# Patient Record
Sex: Male | Born: 1960 | Race: White | Hispanic: No | Marital: Married | Smoking: Never smoker
Health system: Southern US, Community
[De-identification: ages and names within clinical notes are randomized; demographics above are authoritative.]

## PROBLEM LIST (undated history)

## (undated) DIAGNOSIS — C801 Malignant (primary) neoplasm, unspecified: Secondary | ICD-10-CM

## (undated) DIAGNOSIS — E079 Disorder of thyroid, unspecified: Secondary | ICD-10-CM

## (undated) DIAGNOSIS — E7800 Pure hypercholesterolemia, unspecified: Secondary | ICD-10-CM

## (undated) HISTORY — DX: Malignant (primary) neoplasm, unspecified: C80.1

## (undated) HISTORY — PX: EXCISIONAL HEMORRHOIDECTOMY: SHX1541

## (undated) HISTORY — PX: SHOULDER ARTHROSCOPY WITH OPEN ROTATOR CUFF REPAIR: SHX6092

## (undated) HISTORY — PX: INGUINAL HERNIA REPAIR: SHX194

## (undated) HISTORY — DX: Pure hypercholesterolemia, unspecified: E78.00

---

## 2020-06-16 ENCOUNTER — Other Ambulatory Visit: Payer: Self-pay

## 2020-06-16 ENCOUNTER — Encounter (HOSPITAL_COMMUNITY): Payer: Self-pay | Admitting: Emergency Medicine

## 2020-06-16 ENCOUNTER — Inpatient Hospital Stay (HOSPITAL_COMMUNITY)
Admission: EM | Admit: 2020-06-16 | Discharge: 2020-06-18 | DRG: 092 | Disposition: A | Discharge status: Home / Self Care | Payer: BLUE CROSS/BLUE SHIELD | Attending: Neurology | Admitting: Neurology

## 2020-06-16 DIAGNOSIS — E785 Hyperlipidemia, unspecified: Secondary | ICD-10-CM | POA: Diagnosis present

## 2020-06-16 DIAGNOSIS — N179 Acute kidney failure, unspecified: Secondary | ICD-10-CM | POA: Diagnosis present

## 2020-06-16 DIAGNOSIS — Z79890 Hormone replacement therapy (postmenopausal): Secondary | ICD-10-CM

## 2020-06-16 DIAGNOSIS — Z79899 Other long term (current) drug therapy: Secondary | ICD-10-CM

## 2020-06-16 DIAGNOSIS — D6851 Activated protein C resistance: Secondary | ICD-10-CM | POA: Diagnosis present

## 2020-06-16 DIAGNOSIS — E039 Hypothyroidism, unspecified: Secondary | ICD-10-CM | POA: Diagnosis present

## 2020-06-16 DIAGNOSIS — Z20822 Contact with and (suspected) exposure to covid-19: Secondary | ICD-10-CM | POA: Diagnosis present

## 2020-06-16 DIAGNOSIS — G08 Intracranial and intraspinal phlebitis and thrombophlebitis: Secondary | ICD-10-CM | POA: Diagnosis not present

## 2020-06-16 DIAGNOSIS — R569 Unspecified convulsions: Secondary | ICD-10-CM

## 2020-06-16 DIAGNOSIS — R29700 NIHSS score 0: Secondary | ICD-10-CM | POA: Diagnosis present

## 2020-06-16 DIAGNOSIS — I619 Nontraumatic intracerebral hemorrhage, unspecified: Secondary | ICD-10-CM | POA: Diagnosis present

## 2020-06-16 DIAGNOSIS — R519 Headache, unspecified: Secondary | ICD-10-CM | POA: Diagnosis present

## 2020-06-16 HISTORY — DX: Disorder of thyroid, unspecified: E07.9

## 2020-06-16 NOTE — ED Triage Notes (Signed)
Pt BIB [facility] from home, wife reports pt had seizure like activity in his BUE, drooling, and increased respirations. Unknow how long this lasted. No hx seizures, wife reports pt was confused and slightly combative as well. Pt reports he has been being treated for a "viral infection", taking steroids and anti-viral medications. Reports negative covid. Pt A&O x 4 on arrival.

## 2020-06-17 ENCOUNTER — Inpatient Hospital Stay (HOSPITAL_COMMUNITY): Payer: BLUE CROSS/BLUE SHIELD

## 2020-06-17 ENCOUNTER — Encounter (HOSPITAL_COMMUNITY): Payer: Self-pay | Admitting: Neurology

## 2020-06-17 ENCOUNTER — Emergency Department (HOSPITAL_COMMUNITY): Payer: BLUE CROSS/BLUE SHIELD

## 2020-06-17 DIAGNOSIS — R569 Unspecified convulsions: Secondary | ICD-10-CM | POA: Diagnosis present

## 2020-06-17 DIAGNOSIS — G08 Intracranial and intraspinal phlebitis and thrombophlebitis: Secondary | ICD-10-CM | POA: Diagnosis present

## 2020-06-17 DIAGNOSIS — R29700 NIHSS score 0: Secondary | ICD-10-CM | POA: Diagnosis present

## 2020-06-17 DIAGNOSIS — Z79890 Hormone replacement therapy (postmenopausal): Secondary | ICD-10-CM | POA: Diagnosis not present

## 2020-06-17 DIAGNOSIS — E785 Hyperlipidemia, unspecified: Secondary | ICD-10-CM | POA: Diagnosis present

## 2020-06-17 DIAGNOSIS — N179 Acute kidney failure, unspecified: Secondary | ICD-10-CM | POA: Diagnosis present

## 2020-06-17 DIAGNOSIS — E039 Hypothyroidism, unspecified: Secondary | ICD-10-CM | POA: Diagnosis present

## 2020-06-17 DIAGNOSIS — I619 Nontraumatic intracerebral hemorrhage, unspecified: Secondary | ICD-10-CM | POA: Diagnosis present

## 2020-06-17 DIAGNOSIS — Z79899 Other long term (current) drug therapy: Secondary | ICD-10-CM | POA: Diagnosis not present

## 2020-06-17 DIAGNOSIS — Z20822 Contact with and (suspected) exposure to covid-19: Secondary | ICD-10-CM | POA: Diagnosis present

## 2020-06-17 DIAGNOSIS — D6851 Activated protein C resistance: Secondary | ICD-10-CM | POA: Diagnosis present

## 2020-06-17 LAB — COMPREHENSIVE METABOLIC PANEL
ALT: 31 U/L (ref 0–44)
AST: 36 U/L (ref 15–41)
Albumin: 3.7 g/dL (ref 3.5–5.0)
Alkaline Phosphatase: 79 U/L (ref 38–126)
Anion gap: 22 — ABNORMAL HIGH (ref 5–15)
BUN: 8 mg/dL (ref 6–20)
CO2: 12 mmol/L — ABNORMAL LOW (ref 22–32)
Calcium: 8.4 mg/dL — ABNORMAL LOW (ref 8.9–10.3)
Chloride: 103 mmol/L (ref 98–111)
Creatinine, Ser: 1.26 mg/dL — ABNORMAL HIGH (ref 0.61–1.24)
GFR calc Af Amer: >60 mL/min (ref >60)
GFR calc non Af Amer: >60 mL/min (ref >60)
Glucose, Bld: 109 mg/dL — ABNORMAL HIGH (ref 70–99)
Potassium: 3.5 mmol/L (ref 3.5–5.1)
Sodium: 137 mmol/L (ref 135–145)
Total Bilirubin: 0.6 mg/dL (ref 0.3–1.2)
Total Protein: 6.3 g/dL — ABNORMAL LOW (ref 6.5–8.1)

## 2020-06-17 LAB — URINALYSIS, ROUTINE W REFLEX MICROSCOPIC
Bilirubin Urine: NEGATIVE
Glucose, UA: NEGATIVE mg/dL
Ketones, ur: NEGATIVE mg/dL
Leukocytes,Ua: NEGATIVE
Nitrite: NEGATIVE
Protein, ur: NEGATIVE mg/dL
Specific Gravity, Urine: 1.028 (ref 1.005–1.030)
pH: 5 (ref 5.0–8.0)

## 2020-06-17 LAB — CBC WITH DIFFERENTIAL/PLATELET
Abs Immature Granulocytes: 0.47 10*3/uL — ABNORMAL HIGH (ref 0.00–0.07)
Basophils Absolute: 0.1 10*3/uL (ref 0.0–0.1)
Basophils Relative: 1 %
Eosinophils Absolute: 0.1 10*3/uL (ref 0.0–0.5)
Eosinophils Relative: 1 %
HCT: 48.4 % (ref 39.0–52.0)
Hemoglobin: 15.4 g/dL (ref 13.0–17.0)
Immature Granulocytes: 5 %
Lymphocytes Relative: 19 %
Lymphs Abs: 1.9 10*3/uL (ref 0.7–4.0)
MCH: 27.5 pg (ref 26.0–34.0)
MCHC: 31.8 g/dL (ref 30.0–36.0)
MCV: 86.4 fL (ref 80.0–100.0)
Monocytes Absolute: 0.8 10*3/uL (ref 0.1–1.0)
Monocytes Relative: 8 %
Neutro Abs: 6.8 10*3/uL (ref 1.7–7.7)
Neutrophils Relative %: 66 %
Platelets: 269 10*3/uL (ref 150–400)
RBC: 5.6 MIL/uL (ref 4.22–5.81)
RDW: 13.4 % (ref 11.5–15.5)
WBC: 10.2 10*3/uL (ref 4.0–10.5)
nRBC: 0 % (ref 0.0–0.2)

## 2020-06-17 LAB — ANTITHROMBIN III: AntiThromb III Func: 97 % (ref 75–120)

## 2020-06-17 LAB — SARS CORONAVIRUS 2 BY RT PCR (HOSPITAL ORDER, PERFORMED IN ~~LOC~~ HOSPITAL LAB): SARS Coronavirus 2: NEGATIVE

## 2020-06-17 LAB — SEDIMENTATION RATE: Sed Rate: 3 mm/hr (ref 0–16)

## 2020-06-17 LAB — RAPID URINE DRUG SCREEN, HOSP PERFORMED
Amphetamines: NOT DETECTED
Barbiturates: NOT DETECTED
Benzodiazepines: NOT DETECTED
Cocaine: NOT DETECTED
Opiates: NOT DETECTED
Tetrahydrocannabinol: NOT DETECTED

## 2020-06-17 LAB — HIV ANTIBODY (ROUTINE TESTING W REFLEX): HIV Screen 4th Generation wRfx: NONREACTIVE

## 2020-06-17 LAB — MRSA PCR SCREENING: MRSA by PCR: NEGATIVE

## 2020-06-17 LAB — LACTIC ACID, PLASMA: Lactic Acid, Venous: 9.6 mmol/L (ref 0.5–1.9)

## 2020-06-17 LAB — ETHANOL: Alcohol, Ethyl (B): 10 mg/dL — ABNORMAL HIGH (ref <10)

## 2020-06-17 LAB — C-REACTIVE PROTEIN: CRP: 0.8 mg/dL (ref <1.0)

## 2020-06-17 LAB — HEPARIN LEVEL (UNFRACTIONATED): Heparin Unfractionated: 0.18 IU/mL — ABNORMAL LOW (ref 0.30–0.70)

## 2020-06-17 LAB — CK: Total CK: 210 U/L (ref 49–397)

## 2020-06-17 MED ORDER — SODIUM CHLORIDE 0.9 % IV SOLN
1000.0000 mL | INTRAVENOUS | Status: DC
  Administered 2020-06-17: 1000 mL via INTRAVENOUS

## 2020-06-17 MED ORDER — ACETAMINOPHEN 325 MG PO TABS
650.0000 mg | ORAL_TABLET | ORAL | Status: DC | PRN
  Administered 2020-06-17: 650 mg via ORAL
  Filled 2020-06-17: qty 2

## 2020-06-17 MED ORDER — SODIUM CHLORIDE 0.9 % IV SOLN: INTRAVENOUS | Status: DC

## 2020-06-17 MED ORDER — STROKE: EARLY STAGES OF RECOVERY BOOK
Freq: Once | Status: AC
  Filled 2020-06-17: qty 1

## 2020-06-17 MED ORDER — CHLORHEXIDINE GLUCONATE CLOTH 2 % EX PADS
6.0000 | MEDICATED_PAD | Freq: Every day | CUTANEOUS | Status: DC
  Administered 2020-06-17 – 2020-06-18 (×2): 6 via TOPICAL

## 2020-06-17 MED ORDER — ACETAMINOPHEN 650 MG RE SUPP: 650.0000 mg | RECTAL | Status: DC | PRN

## 2020-06-17 MED ORDER — LEVOTHYROXINE SODIUM 25 MCG PO TABS
25.0000 ug | ORAL_TABLET | Freq: Every day | ORAL | Status: DC
  Administered 2020-06-17 – 2020-06-18 (×2): 25 ug via ORAL
  Filled 2020-06-17 (×2): qty 1

## 2020-06-17 MED ORDER — LORAZEPAM 2 MG/ML IJ SOLN
INTRAMUSCULAR | Status: AC
  Administered 2020-06-17: 2 mg
  Filled 2020-06-17: qty 1

## 2020-06-17 MED ORDER — SODIUM CHLORIDE 0.9 % IV BOLUS (SEPSIS)
1000.0000 mL | Freq: Once | INTRAVENOUS | Status: AC
  Administered 2020-06-17: 1000 mL via INTRAVENOUS

## 2020-06-17 MED ORDER — TRAMADOL HCL 50 MG PO TABS
100.0000 mg | ORAL_TABLET | Freq: Four times a day (QID) | ORAL | Status: DC | PRN
  Administered 2020-06-17 – 2020-06-18 (×2): 100 mg via ORAL
  Filled 2020-06-17 (×2): qty 2

## 2020-06-17 MED ORDER — IOHEXOL 350 MG/ML SOLN
75.0000 mL | Freq: Once | INTRAVENOUS | Status: AC | PRN
  Administered 2020-06-17: 75 mL via INTRAVENOUS

## 2020-06-17 MED ORDER — SODIUM CHLORIDE 0.9 % IV SOLN
750.0000 mg | Freq: Two times a day (BID) | INTRAVENOUS | Status: DC
  Administered 2020-06-17 – 2020-06-18 (×2): 750 mg via INTRAVENOUS
  Filled 2020-06-17 (×4): qty 7.5

## 2020-06-17 MED ORDER — GADOBUTROL 1 MMOL/ML IV SOLN
5.0000 mL | Freq: Once | INTRAVENOUS | Status: AC | PRN
  Administered 2020-06-17: 5 mL via INTRAVENOUS

## 2020-06-17 MED ORDER — SENNOSIDES-DOCUSATE SODIUM 8.6-50 MG PO TABS: 1.0000 | ORAL_TABLET | Freq: Every evening | ORAL | Status: DC | PRN

## 2020-06-17 MED ORDER — HEPARIN (PORCINE) 25000 UT/250ML-% IV SOLN
950.0000 [IU]/h | INTRAVENOUS | Status: DC
  Administered 2020-06-17: 800 [IU]/h via INTRAVENOUS
  Administered 2020-06-18: 950 [IU]/h via INTRAVENOUS
  Filled 2020-06-17 (×2): qty 250

## 2020-06-17 MED ORDER — ACETAMINOPHEN 160 MG/5ML PO SOLN: 650.0000 mg | ORAL | Status: DC | PRN

## 2020-06-17 NOTE — Progress Notes (Signed)
ANTICOAGULATION CONSULT NOTE  Pharmacy Consult for heparin  Indication: venous thrombosis  No Known Allergies  Patient Measurements: Wt= 63.5kg  Vital Signs: Temp: 98.6 F (37 C) (09/13 1200) Temp Source: Oral (09/13 1200) BP: 132/77 (09/13 1500) Pulse Rate: 72 (09/13 1500)  Labs: Recent Labs    06/17/20 0051 06/17/20 1447  HGB 15.4  --   HCT 48.4  --   PLT 269  --   HEPARINUNFRC  --  0.18*  CREATININE 1.26*  --   CKTOTAL 210  --     Estimated Creatinine Clearance: 57.4 mL/min (A) (by C-G formula based on SCr of 1.26 mg/dL (H)).   Medical History: Past Medical History:  Diagnosis Date   Thyroid disease      Assessment: 59 yo male here w/ seizure and noted with extensive dural sinus thrombosis. Pharmacy consulted to dose heparin (low dose, no bolus). No anticoagulants noted PTA.  Initial heparin level subtherapeutic at 0.18. CBC wnl. No active bleed issues reported.  Goal of Therapy:  Heparin level 0.3-0.5 units/ml, no boluses Monitor platelets by anticoagulation protocol: Yes   Plan:  No bolus. Increase heparin to 950 units/hr Check 6hr heparin level Monitor daily heparin level and CBC, s/sx bleeding   Arturo Morton, PharmD, BCPS Please check AMION for all [place] contact numbers Clinical Pharmacist 06/17/2020 3:34 PM

## 2020-06-17 NOTE — Procedures (Signed)
Patient Name: Timothy [name]  MRN: [id number]  Epilepsy Attending: Lora Havens  Referring Physician/Provider: Dr Lesleigh Noe Date: 06/17/2020 Duration: 23.44 mins  Patient history: 59yo M with headache, venous sinus thrombosis and seizures. EEG to evaluate for seizure.   Level of alertness: Awake, asleep  AEDs during EEG study: LEV  Technical aspects: This EEG study was done with scalp electrodes positioned according to the 10-20 International system of electrode placement. Electrical activity was acquired at a sampling rate of 500Hz  and reviewed with a high frequency filter of 70Hz  and a low frequency filter of 1Hz . EEG data were recorded continuously and digitally stored.   Description: The posterior dominant rhythm consists of 8 Hz activity of moderate voltage (25-35 uV) seen predominantly in posterior head regions, symmetric and reactive to eye opening and eye closing. Sleep was characterized by vertex waves, sleep spindles (12 to 14 Hz), maximal frontocentral region.  There is an excessive amount of 15 to 18 Hz, 2-3 uV beta activity with irregular morphology distributed symmetrically and diffusely.  Hyperventilation and photic stimulation were not performed.    IMPRESSION: This study is within normal limits. No seizures or epileptiform discharges were seen throughout the recording.  Dillan Lunden Barbra Sarks

## 2020-06-17 NOTE — ED Notes (Signed)
Pt transported to MRI 

## 2020-06-17 NOTE — Consult Note (Deleted)
Neurology Consultation Reason for Consult: new onset seizure Referring Physician: Dr. Addison Lank  CC: Headache   History is obtained from: Patient, wife, EDP and chart review   HPI: Timothy [name] is a 59 y.o. male with a past medical history significant for hyperlipidemia (diet controlled with 1 to 2 glasses of red wine daily), and hypothyroidism on Synthroid  On September 2 while in [place] on a business trip he developed a new onset headache.  This was side locked to the right side, starting at the base of the right neck and radiating up to the top of his head, and has ranged in intensity from 10/10 to 5/10, with some improvement with prednisone or Advil.  He denies any associated nausea, vomiting, light sensitivity, sound sensitivity, emesis.  He has had neck stiffness.  The headache wakes him up from sleep but is not worse in the morning; in fact he feels that it is worse over the course of the day.  It does not improve with laying down.  He does not have pulsatile tinnitus or transient loss of vision with position changes.  He denies diplopia.  Due to high rates of COVID-19 in the facility he was at an [place], he got Covid tested which was negative.  He has not had any respiratory symptoms.  Due to persistent severe headache he presented to an outside ED.  They were concerned for trigeminal neuralgia and started him on a prednisone taper, a 7-day course of famciclovir,  After discharge from the outside ED 1 week ago on 9/5 he had an episode of confusion where he was trying to open the car door but instead was polishing the windshield and making movements that look like he was picking spider webs off of the car door.  He had another episode of confusion on Monday morning where he could not figure out how to set his home security alarm, and on Tuesday where he could not use his computer or text.    This confusion resolved, however on the day of admission at 10 PM while his wife was massaging  his back he suddenly yelled out, arms crossed and clot in tonic position partially up and he became rigid with enlarged pupils and slobbering.  This episode resolved by the time EMS arrived and he had postevent confusion.  He had a nearly identical event in the ED that self resolved after 30 seconds.  At this time neurology was consulted.  Additionally on review of systems he and his wife deny any fevers, chills, urinary or bladder issues, numbness or focal weakness.  Of note his family history is entirely unknown as he was adopted at age 85; therefore his early birth history is unknown as well.  However there is no history of head trauma or prior meningitis or other intracranial processes.  I personally completed the medication reconciliation.  The medications needing review, the patient completed his course of famciclovir, briefly self increased his levothyroxine dose to 50 mcg in case low thyroid levels were contributing to his headache, but then reduce the dose back down to 25 mcg daily he started to have a headache.  LKW: September 2 tPA given?: No, not indicated for cerebral venous thrombosis    Past Medical History:  Diagnosis Date  . 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  Lives with his wife.  Drinks 1-2 glasses of red wine daily for health  benefits, otherwise does not use any other substances.  Exercises regularly.  Exam: Current vital signs: BP 124/87   Pulse (!) 111   Temp 99 F (37.2 C) (Temporal)   Resp (!) 23   SpO2 98%  Vital signs in last 24 hours: Temp:  [99 F (37.2 C)] 99 F (37.2 C) (09/12 2358) Pulse Rate:  [101-129] 111 (09/13 0230) Resp:  [12-30] 23 (09/13 0230) BP: (116-154)/(55-91) 124/87 (09/13 0230) SpO2:  [93 %-98 %] 98 % (09/13 0230)   Physical Exam  Constitutional: Appears well-developed and well-nourished.  Psych: Affect appropriate to situation Eyes: No scleral  injection HENT: No OP obstrucion MSK: no joint deformities.  Cardiovascular: Normal rate and regular rhythm.  Respiratory: Effort normal, non-labored breathing GI: Soft.  No distension. There is no tenderness.  Skin: WDI  Neuro: Mental Status: Patient is sleepy and disoriented to time, but can remember that he was told he had a seizure.  He is able to follow simple but not complex commands.  He has poor attention. Cranial Nerves: II: Visual Fields are full. Pupils are equal, round, and reactive to light.   III,IV, VI: EOMI without ptosis or diploplia.  V: Facial sensation is symmetric to temperature VII: Facial movement is symmetric.  VIII: hearing is intact to voice X: Uvula elevates symmetrically XI: Shoulder shrug is symmetric. XII: tongue is midline without atrophy or fasciculations.  Motor: Tone is normal. Bulk is normal. 5/5 strength was present in all four extremities.  Sensory: Sensation is symmetric to light touch in the arms and legs. Deep Tendon Reflexes: 3+ and symmetric in the biceps and patellae.  Plantars: Toes are downgoing bilaterally.  Cerebellar: FNF and HKS are intact bilaterally   I have reviewed labs in epic and the results pertinent to this consultation are: Lactate 9.6 CBC 10.2 Creatinine 1.26 CK 210  I have reviewed the images obtained: CTA head and neck obtained with filling defects concerning for cerebral venous thrombosis, dry head CT with several small areas of hemorrhage and a small area of ischemia.  Impression: This is a 59 year old gentleman presenting with headache likely secondary to cerebral venous thrombosis.  He requires hypercoagulability work-up, malignancy screening.  His seizures are likely secondary to increased intracranial pressure and his intracerebral hemorrhages, and thus a time-limited course may be reasonable, though he may remain in overall increased risk in the future given his scattered intracerebral hemorrhages and small  stroke.  Recommendations:  #Cerebral venous thrombosis - MRI brain w/ and w/o contrast to confirm cerebral venous thrombosis - Hypercoagulability work-up: Antithrombin III, protein C, protein S, factor V Leiden, prothrombin gene mutation, antiphospholipid syndrome panel - CT chest abdomen pelvis with contrast for malignancy screening - Heparin per pharmacy consult requesting local no bolus protocol - Every 1 hour neuro checks - Admission to stroke team   #Seizures -Keppra 750 mg twice daily,  -Routine EEG  #Hypothyroidism -Continue home levothyroxine 25 mg daily  Lesleigh Noe MD-PhD [facility] [phone]

## 2020-06-17 NOTE — Evaluation (Signed)
Physical Therapy Evaluation Patient Details Name: Timothy Timothy [name] MRN: [id number] DOB: Mar 16, 1961 Today's Date: 06/17/2020   History of Present Illness  59 yo admitted with seizure. Pt with onset of HA while traveling in [place] 9/2 involing Rt neck and head with continued pain and treated for trigeminal neuralgia. Pt with episodes of confusion on 9/5,6, and 7. PMhx: thyroid disease  Clinical Impression  Pt willing to mobilize and reports pain in bil knees and left shoulder with activity. Pt with decreased cognition with memory and processing with impulsivity noted as well as impaired gait who will benefit from acute therapy to maximize mobility and independence to decrease fall risk. Wife present during session and able to state pt with prior surgery to left shoulder but new pain and ROM deficits are related to post seizure.      Follow Up Recommendations Outpatient PT    Equipment Recommendations  None recommended by PT    Recommendations for Other Services OT consult     Precautions / Restrictions Precautions Precautions: Fall      Mobility  Bed Mobility Overal bed mobility: Needs Assistance Bed Mobility: Supine to Sit     Supine to sit: Min guard     General bed mobility comments: guarding for lines and impulsivity  Transfers Overall transfer level: Needs assistance   Transfers: Sit to/from Stand Sit to Stand: Min guard         General transfer comment: guarding for lines, safety  Ambulation/Gait Ambulation/Gait assistance: Min guard Gait Distance (Feet): 250 Feet Assistive device: None Gait Pattern/deviations: Step-through pattern;Decreased stride length   Gait velocity interpretation: >2.62 ft/sec, indicative of community ambulatory General Gait Details: pt with mild instability with gait with decreased stride and guarding for safety and lines with one partial LOB with physical assist to recover  Stairs            Wheelchair Mobility    Modified  Rankin (Stroke Patients Only)       Balance Overall balance assessment: Mild deficits observed, not formally tested                                           Pertinent Vitals/Pain Pain Assessment: 0-10 Pain Score: 5  Pain Location: bil knees and left shoulder Pain Descriptors / Indicators: Aching;Guarding Pain Intervention(s): Limited activity within patient's tolerance;Monitored during session;Repositioned    Home Living Family/patient expects to be discharged to:: Private residence Living Arrangements: Spouse/significant other Available Help at Discharge: Family;Available 24 hours/day Type of Home: House Home Access: Stairs to enter   [employer] of Steps: 3 Home Layout: Two level;Bed/bath upstairs Home Equipment: None      Prior Function Level of Independence: Independent         Comments: pt works, is active and enjoys fast Marine scientist        Extremity/Trunk Assessment   Upper Extremity Assessment Upper Extremity Assessment: LUE deficits/detail LUE Deficits / Details: shoulder flexion lacking grossly 10 degrees with pt reporting pain new since sz    Lower Extremity Assessment Lower Extremity Assessment: Overall WFL for tasks assessed (strength 5/5 with myotome testing with report of pain and instabiliy in Timothy [name])    Cervical / Trunk Assessment Cervical / Trunk Assessment: Normal  Communication   Communication: No difficulties  Cognition Arousal/Alertness: Awake/alert Behavior During Therapy: Impulsive Overall Cognitive Status: Impaired/Different from baseline Area  of Impairment: Memory;Safety/judgement;Following commands                     Memory: Decreased short-term memory Following Commands: Follows one step commands inconsistently Safety/Judgement: Decreased awareness of deficits;Decreased awareness of safety     General Comments: pt climbing out end of bed with all rails up despite cues to  wait and for lines. Pt then instructed to sit at bathroom due to lines tangled and did not recall and tried to stand. Pt with mildly unsteady gait and requires cues for safety      General Comments      Exercises     Assessment/Plan    PT Assessment Patient needs continued PT services  PT Problem List Decreased mobility;Decreased safety awareness;Decreased activity tolerance;Decreased cognition;Decreased balance       PT Treatment Interventions Gait training;Balance training;Stair training;Functional mobility training;Therapeutic activities;Patient/family education;Cognitive remediation    PT Goals (Current goals can be found in the Care Plan section)  Acute Rehab PT Goals Patient Stated Goal: return to work PT Goal Formulation: With patient/family Time For Goal Achievement: 07/01/20 Potential to Achieve Goals: Good    Frequency Min 3X/week   Barriers to discharge        Co-evaluation               AM-PAC PT "6 Clicks" Mobility  Outcome Measure Help needed turning from your back to your side while in a flat bed without using bedrails?: A Little Help needed moving from lying on your back to sitting on the side of a flat bed without using bedrails?: A Little Help needed moving to and from a bed to a chair (including a wheelchair)?: A Little Help needed Timothy [name] up from a chair using your arms (e.g., wheelchair or bedside chair)?: A Little Help needed to walk in hospital room?: A Little Help needed climbing 3-5 steps with a railing? : A Little 6 Click Score: 18    End of Session   Activity Tolerance: Patient tolerated treatment well Patient left: in chair;with call bell/phone within reach;with family/visitor present;with chair alarm set Nurse Communication: Mobility status PT Visit Diagnosis: Other abnormalities of gait and mobility (R26.89);Difficulty in walking, not elsewhere classified (R26.2)    Time: 2878-6767 PT Time Calculation (min) (ACUTE ONLY): 23  min   Charges:   PT Evaluation $PT Eval Moderate Complexity: 1 Mod          Field Staniszewski P, PT Acute Rehabilitation Services Pager: [phone] Office: [phone]   Timothy Timothy [name] 06/17/2020, 1:59 PM

## 2020-06-17 NOTE — ED Provider Notes (Signed)
Attestation: Medical screening examination/treatment/procedure(s) were conducted as a shared visit with non-physician practitioner(s) and myself.  I personally evaluated the patient during the encounter.  = Briefly, the patient is a 59 y.o. male with h/o thyroid disease, here for new onset seizure.  Patient recently evaluated for occipital/trigeminal neuralgia in outside hospital with a negative Noncon CT scan.  Placed on famciclovir and prednisone.  No recent infectious symptoms.  Vitals:   06/16/20 2347 06/16/20 2358  BP:    Pulse: (!) 101   Resp: 13   Temp:  99 F (37.2 C)  SpO2: 93%     CONSTITUTIONAL: Ill-appearing, NAD NEURO: Postictal, localizes to pain, moves all extremities. EYES:  pupils equal and reactive ENT/NECK:  trachea midline, no JVD CARDIO: Tacky rate, regular rhythm, well-perfused PULM:  labored breathing GI/GU:  Abdomen non-distended MSK/SPINE:  No gross deformities, no edema SKIN:  no rash, atraumatic, diaphoretic    EKG Interpretation  Date/Time:    Ventricular Rate:    PR Interval:    QRS Duration:   QT Interval:    QTC Calculation:   R Axis:     Text Interpretation:         Work up notable for venous sinus thrombosis.  Neurology consulted and admitted the patient to the neuro ICU.   Procedures CRITICAL CARE Performed by: Grayce Sessions Faron Whitelock Total critical care time: 20 minutes Critical care time was exclusive of separately billable procedures and treating other patients. Critical care was necessary to treat or prevent imminent or life-threatening deterioration. Critical care was time spent personally by me on the following activities: development of treatment plan with patient and/or surrogate as well as nursing, discussions with consultants, evaluation of patient's response to treatment, examination of patient, obtaining history from patient or surrogate, ordering and performing treatments and interventions, ordering and review of laboratory  studies, ordering and review of radiographic studies, pulse oximetry and re-evaluation of patient's condition.       Fatima Blank, MD 06/17/20 1827

## 2020-06-17 NOTE — ED Notes (Signed)
Admitting at bedside 

## 2020-06-17 NOTE — Evaluation (Addendum)
Speech Language Pathology Evaluation Patient Details Name: Lorraine Terriquez MRN: [id number] DOB: October 31, 1960 Today's Date: 06/17/2020 Time: 1530-1600 SLP Time Calculation (min) (ACUTE ONLY): 30 min  Problem List:  Patient Active Problem List   Diagnosis Date Noted  . Cerebral venous thrombosis 06/17/2020  . Cerebral thrombosis with cerebral infarction 06/17/2020  . Cerebral embolism with cerebral infarction 06/17/2020  . Subarachnoid hemorrhage 06/17/2020  . Intracerebral hemorrhage 06/17/2020   Past Medical History:  Past Medical History:  Diagnosis Date  . Thyroid disease    Past Surgical History: The histories are not reviewed yet. Please review them in the "History" navigator section and refresh this [place].  HPI: 59yo male admitted 06/17/20 with seizures, headaches x1 week. PMH: thyroid disease, HLD. MRI = Extensive dural sinus thrombosis involving the superior sagittal sinus, right transverse sinus, and right sigmoid sinus. There isTrolard involvement on the left where there is parietal edema and subcentimeter hemorrhages.   Assessment / Plan / Recommendation Clinical Impression  Pt seen for cognitive linguistic assessment. Pt reports working full time prior to admit, and was independent with finances and medication. [address] Mental Status (SLUMS) Examination was administered. Pt scored 19/30, raising concern for neurocognitive deficits. Points were lost on thought organization, delayed recall, digit reversal, and auditory attention and recall. Higher level cognitive deficits are anticipated.   Outpatient speech therapy is recommended at DC to address cognitive impairments to maximize safety and return to prior level of function.    SLP Assessment  SLP Recommendation/Assessment: All further Speech Language Pathology needs can be addressed in the next venue of care  SLP Visit Diagnosis: Cognitive communication deficit (R41.841)    Follow Up Recommendations   Outpatient SLP       SLP Evaluation Cognition  Overall Cognitive Status: Impaired/Different from baseline Arousal/Alertness: Awake/alert Orientation Level: Oriented X4       Comprehension  Auditory Comprehension Overall Auditory Comprehension: Appears within functional limits for tasks assessed    Expression Expression Primary Mode of Expression: Verbal Verbal Expression Overall Verbal Expression: Appears within functional limits for tasks assessed Written Expression Dominant Hand: Right   Oral / Motor  Oral Motor/Sensory Function Overall Oral Motor/Sensory Function: Within functional limits Motor Speech Overall Motor Speech: Appears within functional limits for tasks assessed Intelligibility: Intelligible   GO                   Cesar Alf B. Quentin Ore, [facility], [place] Speech Language Pathologist Office: [phone] Pager: [phone]  Shonna Chock 06/17/2020, 4:06 PM

## 2020-06-17 NOTE — ED Notes (Signed)
ED PA at bedside, pt began seizing, 2mg  ativan given per verbal order. SpO2 decreased to 60s, NRB applied with improvement.

## 2020-06-17 NOTE — H&P (Addendum)
Please see "consult note" by myself, re-titled here are admission note and reproduced for ease of reference:    Neurology H&P Reason for Consult: new onset seizure Referring Physician: Dr. Addison Lank  CC: Headache   History is obtained from: Patient, wife, EDP and chart review   HPI: Timothy [name] is a 59 y.o. male with a past medical history significant for hyperlipidemia (diet controlled with 1 to 2 glasses of red wine daily), and hypothyroidism on Synthroid  On September 2 while in [place] on a business trip he developed a new onset headache.  This was side locked to the right side, starting at the base of the right neck and radiating up to the top of his head, and has ranged in intensity from 10/10 to 5/10, with some improvement with prednisone or Advil.  He denies any associated nausea, vomiting, light sensitivity, sound sensitivity, emesis.  He has had neck stiffness.  The headache wakes him up from sleep but is not worse in the morning; in fact he feels that it is worse over the course of the day.  It does not improve with laying down.  He does not have pulsatile tinnitus or transient loss of vision with position changes.  He denies diplopia.  Due to high rates of COVID-19 in the facility he was at an [place], he got Covid tested which was negative.  He has not had any respiratory symptoms.  Due to persistent severe headache he presented to an outside ED.  They were concerned for trigeminal neuralgia and started him on a prednisone taper, a 7-day course of famciclovir,  After discharge from the outside ED 1 week ago on 9/5 he had an episode of confusion where he was trying to open the car door but instead was polishing the windshield and making movements that look like he was picking spider webs off of the car door.  He had another episode of confusion on Monday morning where he could not figure out how to set his home security alarm, and on Tuesday where he could not use his computer or  text.    This confusion resolved, however on the day of admission at 10 PM while his wife was massaging his back he suddenly yelled out, arms crossed and clot in tonic position partially up and he became rigid with enlarged pupils and slobbering.  This episode resolved by the time EMS arrived and he had postevent confusion.  He had a nearly identical event in the ED that self resolved after 30 seconds.  At this time neurology was consulted.  Additionally on review of systems he and his wife deny any fevers, chills, urinary or bladder issues, numbness or focal weakness.  Of note his family history is entirely unknown as he was adopted at age 40; therefore his early birth history is unknown as well.  However there is no history of head trauma or prior meningitis or other intracranial processes.  I personally completed the medication reconciliation.  The medications needing review, the patient completed his course of famciclovir, briefly self increased his levothyroxine dose to 50 mcg in case low thyroid levels were contributing to his headache, but then reduce the dose back down to 25 mcg daily he started to have a headache.  LKW: September 2 tPA given?: No, not indicated for cerebral venous thrombosis    Past Medical History:  Diagnosis Date  . 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  Lives with his wife.  Drinks 1-2 glasses of red wine daily for health benefits, otherwise does not use any other substances.  Exercises regularly.  Exam: Current vital signs: BP 124/87   Pulse (!) 111   Temp 99 F (37.2 C) (Temporal)   Resp (!) 23   SpO2 98%  Vital signs in last 24 hours: Temp:  [99 F (37.2 C)] 99 F (37.2 C) (09/12 2358) Pulse Rate:  [101-129] 111 (09/13 0230) Resp:  [12-30] 23 (09/13 0230) BP: (116-154)/(55-91) 124/87 (09/13 0230) SpO2:  [93 %-98 %] 98 % (09/13 0230)   Physical Exam  Constitutional:  Appears well-developed and well-nourished.  Psych: Affect appropriate to situation Eyes: No scleral injection HENT: No OP obstrucion MSK: no joint deformities.  Cardiovascular: Normal rate and regular rhythm.  Respiratory: Effort normal, non-labored breathing GI: Soft.  No distension. There is no tenderness.  Skin: WDI  Neuro: Mental Status: Patient is sleepy and disoriented to time, but can remember that he was told he had a seizure.  He is able to follow simple but not complex commands.  He has poor attention. Cranial Nerves: II: Visual Fields are full. Pupils are equal, round, and reactive to light.   III,IV, VI: EOMI without ptosis or diploplia.  V: Facial sensation is symmetric to temperature VII: Facial movement is symmetric.  VIII: hearing is intact to voice X: Uvula elevates symmetrically XI: Shoulder shrug is symmetric. XII: tongue is midline without atrophy or fasciculations.  Motor: Tone is normal. Bulk is normal. 5/5 strength was present in all four extremities.  Sensory: Sensation is symmetric to light touch in the arms and legs. Deep Tendon Reflexes: 3+ and symmetric in the biceps and patellae.  Plantars: Toes are downgoing bilaterally.  Cerebellar: FNF and HKS are intact bilaterally   I have reviewed labs in epic and the results pertinent to this consultation are: Lactate 9.6 CBC 10.2 Creatinine 1.26 CK 210  I have reviewed the images obtained: CTA head and neck obtained with filling defects concerning for cerebral venous thrombosis, dry head CT with several small areas of hemorrhage and a small area of ischemia.  Impression: This is a 59 year old gentleman presenting with headache likely secondary to cerebral venous thrombosis.  He requires hypercoagulability work-up, malignancy screening.  His seizures are likely secondary to increased intracranial pressure and his intracerebral hemorrhages, and thus a time-limited course may be reasonable, though he may  remain in overall increased risk in the future given his scattered intracerebral hemorrhages and small stroke.  Recommendations:  #Cerebral venous thrombosis - MRI brain w/ and w/o contrast to confirm cerebral venous thrombosis - Hypercoagulability work-up: Antithrombin III, protein C, protein S, factor V Leiden, prothrombin gene mutation, antiphospholipid syndrome panel - CT chest abdomen pelvis with contrast for malignancy screening - Heparin per pharmacy consult requesting local no bolus protocol - Every 1 hour neuro checks - Admission to stroke team   #Seizures -Keppra 750 mg twice daily,  -Routine EEG  #Hypothyroidism -Continue home levothyroxine 25 mg daily  Lesleigh Noe MD-PhD [facility] [phone]  29 minutes spent in critical care of this patient

## 2020-06-17 NOTE — Progress Notes (Signed)
STROKE TEAM PROGRESS NOTE   INTERVAL HISTORY His wife is at the bedside.  I have personally reviewed history of presenting illness with the patient and his wife, electronic medical records and imaging films in PACS.  He complains of mild headache 2/10.  He states he had a episode of transient confusion and disorientation.  He had witnessed seizure in the ER.  He has been started on Keppra.  He has had no further seizures.  His blood pressure has been adequately controlled.  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  He has been started on IV heparin.  Lab work for hypercoagulable panel has been sent.  He denies prior history of DVT, pulmonary embolism.  He denies prolonged travel in airplane or car.  Vitals:   06/17/20 1300 06/17/20 1400 06/17/20 1500 06/17/20 1600  BP: 104/64 124/87 132/77 115/78  Pulse: (!) 56 65 72 60  Resp: 13 13 (!) 21 18  Temp:    98.9 F (37.2 C)  TempSrc:    Oral  SpO2: 92% 93% 94% 96%  Weight:      Height:       CBC:  Recent Labs  Lab 06/17/20 0051  WBC 10.2  NEUTROABS 6.8  HGB 15.4  HCT 48.4  MCV 86.4  PLT 124   Basic Metabolic Panel:  Recent Labs  Lab 06/17/20 0051  NA 137  K 3.5  CL 103  CO2 12*  GLUCOSE 109*  BUN 8  CREATININE 1.26*  CALCIUM 8.4*   Lipid Panel: No results for input(s): CHOL, TRIG, HDL, CHOLHDL, VLDL, LDLCALC in the last 168 hours. HgbA1c: No results for input(s): HGBA1C in the last 168 hours. Urine Drug Screen:  Recent Labs  Lab 06/17/20 0257  LABOPIA NONE DETECTED  COCAINSCRNUR NONE DETECTED  LABBENZ NONE DETECTED  AMPHETMU NONE DETECTED  THCU NONE DETECTED  LABBARB NONE DETECTED    Alcohol Level  Recent Labs  Lab 06/17/20 0051  ETH 10*    IMAGING past 24 hours EEG  Result Date: 06/17/2020 Lora Havens, MD     06/17/2020  1:52 PM Patient Name: Timothy [name] MRN: [id number] Epilepsy Attending: Lora Havens Referring Physician/Provider: Dr  Lesleigh Noe Date: 06/17/2020 Duration: 23.44 mins Patient history: 59yo M with headache, venous sinus thrombosis and seizures. EEG to evaluate for seizure. Level of alertness: Awake, asleep AEDs during EEG study: LEV Technical aspects: This EEG study was done with scalp electrodes positioned according to the 10-20 International system of electrode placement. Electrical activity was acquired at a sampling rate of 500Hz  and reviewed with a high frequency filter of 70Hz  and a low frequency filter of 1Hz . EEG data were recorded continuously and digitally stored. Description: The posterior dominant rhythm consists of 8 Hz activity of moderate voltage (25-35 uV) seen predominantly in posterior head regions, symmetric and reactive to eye opening and eye closing. Sleep was characterized by vertex waves, sleep spindles (12 to 14 Hz), maximal frontocentral region.  There is an excessive amount of 15 to 18 Hz, 2-3 uV beta activity with irregular morphology distributed symmetrically and diffusely.  Hyperventilation and photic stimulation were not performed.   IMPRESSION: This study is within normal limits. No seizures or epileptiform discharges were seen throughout the recording. [name]   CT ANGIO HEAD W OR WO CONTRAST  Result Date: 06/17/2020 CLINICAL DATA:  Initial evaluation for acute seizure like activity, possible stroke. EXAM: CT ANGIOGRAPHY HEAD AND NECK TECHNIQUE: Multidetector  CT imaging of the head and neck was performed using the standard protocol during bolus administration of intravenous contrast. Multiplanar CT image reconstructions and MIPs were obtained to evaluate the vascular anatomy. Carotid stenosis measurements (when applicable) are obtained utilizing NASCET criteria, using the distal internal carotid diameter as the denominator. CONTRAST:  59mL OMNIPAQUE IOHEXOL 350 MG/ML SOLN COMPARISON:  None available. FINDINGS: CT HEAD FINDINGS Brain: Age-related cerebral atrophy. Focal wedge-shaped  hypodensity seen involving the posterior left parietal lobe (series 5, image 24), concerning for evolving infarct. Few small foci of associated hemorrhage seen at the deep aspect of this ischemic change, largest of which measures 6 mm. No associated mass effect. No other evidence for acute intracranial hemorrhage or large vessel territory infarct. No mass lesion or midline shift. No hydrocephalus or extra-axial fluid collection. Vascular: No hyperdense vessel. Skull: Scalp soft tissues and calvarium within normal limits. Sinuses: Mild scattered mucosal thickening noted within the ethmoidal air cells. Paranasal sinuses are otherwise clear. No mastoid effusion. Orbits: Globes and orbital soft tissues demonstrate no acute finding. Review of the MIP images confirms the above findings CTA NECK FINDINGS Aortic arch: Visualized aortic arch normal caliber with normal 3 vessel morphology. No hemodynamically significant stenosis or other abnormality seen about the origin of the great vessels. Right carotid system: Right common and internal carotid arteries widely patent without stenosis, dissection or occlusion. Left carotid system: Left common and internal carotid arteries widely patent without stenosis, dissection or occlusion. Vertebral arteries: Both vertebral arteries arise from the subclavian arteries. Atheromatous plaque at the origins of both vertebral arteries with no more than mild stenosis. Vertebral arteries otherwise widely patent without stenosis, dissection or occlusion. Skeleton: No suspicious osseous lesions. Mild multilevel cervical spondylosis, most pronounced at C5-6 and C6-7. Minimal wedging at the superior endplates of T3 and T5 noted, favored to be chronic. Calcification at the longus coli tendon without associated inflammation to suggest acute tendinitis. Other neck: No other acute soft tissue abnormality within the neck. No mass lesion or adenopathy. Upper chest: Mild atelectatic changes noted within  the visualized lungs. Visualized upper chest demonstrates no other acute finding. Review of the MIP images confirms the above findings CTA HEAD FINDINGS Anterior circulation: Petrous segments widely patent bilaterally. Minimal atheromatous change within the right carotid siphon without flow-limiting stenosis. Left carotid siphon widely patent. A1 segments patent. Normal anterior communicating artery complex. Anterior cerebral arteries patent to their distal aspects without stenosis. No M1 stenosis or occlusion. Normal MCA bifurcations. Distal MCA branches well perfused and symmetric. Posterior circulation: Both vertebral arteries patent to the vertebrobasilar junction without stenosis. Both picas patent. Basilar patent to its distal aspect without stenosis. Superior cerebral arteries patent bilaterally. Both PCAs primarily supplied via the basilar well perfused to their distal aspects. Venous sinuses: Apparent focal filling defects seen throughout the superior sagittal sinus to the torcula. Extension into the right transverse and sigmoid sinuses to the level of the jugular bulb. 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 Left transverse and sigmoid sinuses appear patent as does the proximal left internal jugular vein. Possible additional partial filling defect noted within the straight sinus. Anatomic variants: None significant.  No intracranial aneurysm. Review of the MIP images confirms the above findings IMPRESSION: CT HEAD IMPRESSION: 1. Abnormal hypodensity involving the left parietal cortex, concerning for acute ischemia. Few small foci of associated hemorrhage as above. 2. No other acute intracranial abnormality. CTA HEAD AND NECK IMPRESSION: 1. Negative CTA for large vessel occlusion. No hemodynamically significant or correctable stenosis  about the major arterial vasculature of the head and neck. 2. Long segment filling defects within the superior sagittal, right transverse, and sigmoid sinuses,  concerning for dural sinus thrombosis. Possible partial filling defect within the straight sinus as well. Finding raises the possibility that the above described left cerebral infarct may reflect a venous infarct due to underlying occult cortical vein thrombosis. Further assessed with dedicated MRI could be performed for further evaluation. Critical Value/emergent results were discussed by telephone at the time of interpretation on 06/17/2020 at 3 a.m. to provider SRISHTI BHAGAT ; [address] , who verbally acknowledged these results. Electronically Signed   By: Jeannine Boga M.D.   On: 06/17/2020 03:51   CT Angio Neck W and/or Wo Contrast  Result Date: 06/17/2020 CLINICAL DATA:  Initial evaluation for acute seizure like activity, possible stroke. EXAM: CT ANGIOGRAPHY HEAD AND NECK TECHNIQUE: Multidetector CT imaging of the head and neck was performed using the standard protocol during bolus administration of intravenous contrast. Multiplanar CT image reconstructions and MIPs were obtained to evaluate the vascular anatomy. Carotid stenosis measurements (when applicable) are obtained utilizing NASCET criteria, using the distal internal carotid diameter as the denominator. CONTRAST:  32mL OMNIPAQUE IOHEXOL 350 MG/ML SOLN COMPARISON:  None available. FINDINGS: CT HEAD FINDINGS Brain: Age-related cerebral atrophy. Focal wedge-shaped hypodensity seen involving the posterior left parietal lobe (series 5, image 24), concerning for evolving infarct. Few small foci of associated hemorrhage seen at the deep aspect of this ischemic change, largest of which measures 6 mm. No associated mass effect. No other evidence for acute intracranial hemorrhage or large vessel territory infarct. No mass lesion or midline shift. No hydrocephalus or extra-axial fluid collection. Vascular: No hyperdense vessel. Skull: Scalp soft tissues and calvarium within normal limits. Sinuses: Mild scattered mucosal thickening noted within  the ethmoidal air cells. Paranasal sinuses are otherwise clear. No mastoid effusion. Orbits: Globes and orbital soft tissues demonstrate no acute finding. Review of the MIP images confirms the above findings CTA NECK FINDINGS Aortic arch: Visualized aortic arch normal caliber with normal 3 vessel morphology. No hemodynamically significant stenosis or other abnormality seen about the origin of the great vessels. Right carotid system: Right common and internal carotid arteries widely patent without stenosis, dissection or occlusion. Left carotid system: Left common and internal carotid arteries widely patent without stenosis, dissection or occlusion. Vertebral arteries: Both vertebral arteries arise from the subclavian arteries. Atheromatous plaque at the origins of both vertebral arteries with no more than mild stenosis. Vertebral arteries otherwise widely patent without stenosis, dissection or occlusion. Skeleton: No suspicious osseous lesions. Mild multilevel cervical spondylosis, most pronounced at C5-6 and C6-7. Minimal wedging at the superior endplates of T3 and T5 noted, favored to be chronic. Calcification at the longus coli tendon without associated inflammation to suggest acute tendinitis. Other neck: No other acute soft tissue abnormality within the neck. No mass lesion or adenopathy. Upper chest: Mild atelectatic changes noted within the visualized lungs. Visualized upper chest demonstrates no other acute finding. Review of the MIP images confirms the above findings CTA HEAD FINDINGS Anterior circulation: Petrous segments widely patent bilaterally. Minimal atheromatous change within the right carotid siphon without flow-limiting stenosis. Left carotid siphon widely patent. A1 segments patent. Normal anterior communicating artery complex. Anterior cerebral arteries patent to their distal aspects without stenosis. No M1 stenosis or occlusion. Normal MCA bifurcations. Distal MCA branches well perfused and  symmetric. Posterior circulation: Both vertebral arteries patent to the vertebrobasilar junction without stenosis. Both picas patent. Basilar  patent to its distal aspect without stenosis. Superior cerebral arteries patent bilaterally. Both PCAs primarily supplied via the basilar well perfused to their distal aspects. Venous sinuses: Apparent focal filling defects seen throughout the superior sagittal sinus to the torcula. Extension into the right transverse and sigmoid sinuses to the level of the jugular bulb. 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 Left transverse and sigmoid sinuses appear patent as does the proximal left internal jugular vein. Possible additional partial filling defect noted within the straight sinus. Anatomic variants: None significant.  No intracranial aneurysm. Review of the MIP images confirms the above findings IMPRESSION: CT HEAD IMPRESSION: 1. Abnormal hypodensity involving the left parietal cortex, concerning for acute ischemia. Few small foci of associated hemorrhage as above. 2. No other acute intracranial abnormality. CTA HEAD AND NECK IMPRESSION: 1. Negative CTA for large vessel occlusion. No hemodynamically significant or correctable stenosis about the major arterial vasculature of the head and neck. 2. Long segment filling defects within the superior sagittal, right transverse, and sigmoid sinuses, concerning for dural sinus thrombosis. Possible partial filling defect within the straight sinus as well. Finding raises the possibility that the above described left cerebral infarct may reflect a venous infarct due to underlying occult cortical vein thrombosis. Further assessed with dedicated MRI could be performed for further evaluation. Critical Value/emergent results were discussed by telephone at the time of interpretation on 06/17/2020 at 3 a.m. to provider SRISHTI BHAGAT ; [address]ine [address] , who verbally acknowledged these results. Electronically Signed   By: Jeannine Boga M.D.   On: 06/17/2020 03:51   MR BRAIN W WO CONTRAST  Result Date: 06/17/2020 CLINICAL DATA:  Seizure with abnormal neuro exam. EXAM: MRI HEAD WITHOUT AND WITH CONTRAST TECHNIQUE: Multiplanar, multiecho pulse sequences of the brain and surrounding structures were obtained without and with intravenous contrast. CONTRAST:  28mL GADAVIST GADOBUTROL 1 MMOL/ML IV SOLN COMPARISON:  Head CT and CTA from earlier today FINDINGS: Brain: Dural sinus thrombosis extending throughout the superior sagittal sinus, right transverse sinus, and right sigmoid sinus. There is Trolard involvement on the left where there is parietal edema without infarct. Subcentimeter parenchymal hemorrhage are seen at the level of left parietal involvement, best seen on head CT coronal reformats. Congested vessels are also seen along the cortex at this level on gradient imaging. No underlying vascular malformation is seen by CTA. No hydrocephalus, mass, or collection. 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 There is Trolard involvement on the left where there is parietal edema and subcentimeter hemorrhages. Electronically Signed   By: Monte Fantasia M.D.   On: 06/17/2020 05:06    PHYSICAL EXAM Pleasant middle-aged Caucasian male not in distress. . Afebrile. Head is nontraumatic. Neck is supple without bruit.    Cardiac exam no murmur or gallop. Lungs are clear to auscultation. Distal pulses are well felt. Neurological Exam : Awake alert oriented to time place and person.  Diminished attention, registration and recall.  No aphasia apraxia or dysarthria.  Extraocular movements are full range without nystagmus.  Blinks to threat bilaterally.  Fundi not visualized.  Face is symmetric without weakness.  Tongue midline.  Motor system exam symmetric  upper and lower extremity strength without focal weakness.  Symmetric deep tendon reflexes.  Sensation is preserved.  Coordination is accurate.  Gait not tested.  ASSESSMENT/PLAN Mr. Jakayden Cancio is a 59 y.o. male with  history of HLD and hypothryoidism presenting with severe HA and neck pain,  initially seen 9/2 in [place]. Developed several episodes of confusion and presented to [facility] in [place] on 9/5 where CT head was negative and treated for trigeminal neuralgia w/ famciclovir and prednisone taper.  Now presenting with full body seizure. CT shows sinus thrombosis.     Dural sinus thrombosis    CT head L parietal hypodensity w/ few foci scattered hemorrhage .  CTA head & neck no LVO. No arterial stenosis. Dural sinus thrombosis superior sagittal, R transverse and sigmoid sinuses w/ partial filling defect straight sinus.  MRI  Extensive dural sinus thrombosis (superior sagittal, R transverse, R sigmoid sinuses). L parietal edema w/ tiny hemorrhages.  LDL pending    HgbA1c pending    Hypercoagulable labs pending    Lactic acid 9.6   Patient has not been vaccinated for COVID. Dr. Leonie Man encouraged both he and his wife to take vaccine. They want to think and talk about it.   VTE prophylaxis - IV heparin  No antithrombotic prior to admission, now on heparin IV. Plan to transition to eliquis in a day or so.  Therapy recommendations:  OP SLP, OP PT, OT pending   Disposition:  Return home  [place] to be OOB  Increase IVF to 139mL/hr  Headache  D/t sinus thrombosis  On tylenol prn  Add tramadol prn  Seizure  EEG pending   Keppra load, now on 500 bid    Hyperlipidemia  Home meds:  None, diet controlled  LDL pending, goal < 70  Other Stroke Risk Factors  ETOH use, alcohol level 10, advised to drink no more than 2 drink(s) a day  Other Active Problems  Hypothyroidism on synthroid  AKI, Cre 1.26  Hospital day # 0 He has presented with subacute headaches followed by  transient confusion and weakness seizure secondary to 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  Recommend aggressive hydration with normal saline as well as IV heparin and likely change to Pradaxa at the time of discharge.  Check CT scan of chest abdomen pelvis for malignancy and await hypercoagulable panel labs.  Long discussion with patient and wife and answered questions.  Mobilize out of bed.  Therapy consults. This patient is critically ill and at significant risk of neurological worsening, death and care requires constant monitoring of vital signs, hemodynamics,respiratory and cardiac monitoring, extensive review of multiple databases, frequent neurological assessment, discussion with family, other specialists and medical decision making of high complexity.I have made any additions or clarifications directly to the above note.This critical care time does not reflect procedure time, or teaching time or supervisory time of PA/NP/Med Resident etc but could involve care discussion time.  I spent 35 minutes of neurocritical care time  in the care of  this patient. Antony Contras, MD    To contact Stroke Continuity provider, please refer to [email or web address] After hours, contact General Neurology

## 2020-06-17 NOTE — ED Notes (Signed)
1g keppra IV started per verbal order.

## 2020-06-17 NOTE — Progress Notes (Signed)
ANTICOAGULATION CONSULT NOTE - Initial Consult  Pharmacy Consult for heparin  Indication: venous thrombosis  No Known Allergies  Patient Measurements: Wt= 63.5kg  Vital Signs: Temp: 99.5 F (37.5 C) (09/13 0503) Temp Source: Oral (09/13 0503) BP: 123/79 (09/13 0500) Pulse Rate: 89 (09/13 0500)  Labs: Recent Labs    06/17/20 0051  HGB 15.4  HCT 48.4  PLT 269  CREATININE 1.26*  CKTOTAL 210    CrCl cannot be calculated (Unknown ideal weight.).   Medical History: Past Medical History:  Diagnosis Date  . Thyroid disease      Assessment: 59 yo male here w/ seizure and noted with extensive dural sinus thrombosis. Pharmacy consulted to dose heparin (low dose, no bolus). No anticoagulants noted PTA. -Hg= 15.4, plt= 269  Goal of Therapy:  Heparin level 0.3-0.5 units/ml Monitor platelets by anticoagulation protocol: Yes   Plan:  -No heparin bolus -Begin heparin infusion at 800 units/hr -Heparin level in 6 hours and daily wth CBC daily   Hildred Laser, PharmD Clinical Pharmacist **Pharmacist phone directory can now be found on [email or web address] ([email or web address]).  Listed under [place].

## 2020-06-17 NOTE — Progress Notes (Signed)
EEG complete - results pending 

## 2020-06-17 NOTE — ED Provider Notes (Signed)
[facility] EMERGENCY DEPARTMENT Provider Note   CSN: [id number] Arrival date & time: 06/16/20  2339     History Chief Complaint  Patient presents with  . Seizures    Timothy [name] is a 59 y.o. male.  Patient presents to the emergency department with a chief complaint of seizure.  He is accompanied by his wife.  Patient tells me that he has had headaches for the past week.  He was seen at [facility] ER and had a CT scan and was ultimately treated for was thought to be trigeminal neuralgia with prednisone and acyclovir.  Patient denies any fevers.  He states that the headache has started to come back now that the prednisone taper is winding down.  States that headache radiates up from the right side of his neck to the right side of his head.  He denies numbness, weakness, or tingling in his extremities.  His wife states that when they left the hospital, his balance seemed a little bit off and he had struggles opening the car door.  She states that tonight she was rubbing his neck and he began to grunt and went into a full body seizure.  She called EMS and patient was brought to the emergency department.  The history is provided by the patient. No language interpreter was used.       Past Medical History:  Diagnosis Date  . Thyroid disease     There are no problems to display for this patient.     No family history on file.  Social History   Tobacco Use  . Smoking status: Not on file  Substance Use Topics  . Alcohol use: Not on file  . Drug use: Not on file    Home Medications Prior to Admission medications   Not on File    Allergies    Patient has no known allergies.  Review of Systems   Review of Systems  Neurological: Positive for seizures.  All other systems reviewed and are negative.   Physical Exam Updated Vital Signs BP (!) 154/81   Pulse (!) 101   Temp 99 F (37.2 C) (Temporal)   Resp 13   SpO2 93%   Physical Exam Vitals and  nursing note reviewed.  Constitutional:      Appearance: He is well-developed.  HENT:     Head: Normocephalic and atraumatic.  Eyes:     Conjunctiva/sclera: Conjunctivae normal.  Cardiovascular:     Rate and Rhythm: Normal rate and regular rhythm.     Heart sounds: No murmur heard.   Pulmonary:     Effort: Pulmonary effort is normal. No respiratory distress.     Breath sounds: Normal breath sounds.  Abdominal:     Palpations: Abdomen is soft.     Tenderness: There is no abdominal tenderness.  Musculoskeletal:     Cervical back: Neck supple.  Skin:    General: Skin is warm and dry.  Neurological:     Mental Status: He is alert.     ED Results / Procedures / Treatments   Labs (all labs ordered are listed, but only abnormal results are displayed) Labs Reviewed  SARS CORONAVIRUS 2 BY RT PCR (HOSPITAL ORDER, [place] [facility])  CBC WITH DIFFERENTIAL/PLATELET  COMPREHENSIVE METABOLIC PANEL  CK  LACTIC ACID, PLASMA  ETHANOL  CBG MONITORING, ED    EKG None  Radiology No results found.  Procedures .Critical Care Performed by: Montine Circle, PA-C Authorized by: Marlon Pel,  Herbie Baltimore, PA-C   Critical care provider statement:    Critical care time (minutes):  45   Critical care was necessary to treat or prevent imminent or life-threatening deterioration of the following conditions:  CNS failure or compromise   Critical care was time spent personally by me on the following activities:  Discussions with consultants, evaluation of patient's response to treatment, examination of patient, ordering and performing treatments and interventions, ordering and review of laboratory studies, ordering and review of radiographic studies, pulse oximetry, re-evaluation of patient's condition, obtaining history from patient or surrogate and review of old charts   (including critical care time)  Medications Ordered in ED Medications  sodium chloride 0.9 % bolus 1,000 mL  (has no administration in time range)    Followed by  sodium chloride 0.9 % bolus 1,000 mL (has no administration in time range)    Followed by  0.9 %  sodium chloride infusion (has no administration in time range)  LORazepam (ATIVAN) 2 MG/ML injection (2 mg  Given 06/17/20 0015)    ED Course  I have reviewed the triage vital signs and the nursing notes.  Pertinent labs & imaging results that were available during my care of the patient were reviewed by me and considered in my medical decision making (see chart for details).    MDM Rules/Calculators/A&P                           Patient experienced approximately 45-second tonic-clonic seizure during my interview.  Patient was given 2 mg Ativan and 1 g Keppra.  O2 saturation dropped into the mid 60s.  I suctioned the patient and nonrebreather mask was applied with good improvement of O2 sat.  Dr. Leonette Monarch was called to the bedside to evaluate patient.  Patient remains postictal.  This patient complains of seizures, this involves an extensive number of treatment options, and is a complaint that carries with it a high risk of complications and morbidity.    Differential Dx Mass, tumor, infection, ETOH withdrawal  Pertinent Labs I ordered, reviewed, and interpreted labs, which included CBC, CMP, CK, lactic, ethanol.  Imaging Interpretation I ordered imaging studies which included CT angio head/neck, which showed Negative CTA for large vessel occlusion. No hemodynamically  significant or correctable stenosis about the major arterial  vasculature of the head and neck.  2. 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 Possible partial filling defect within the straight  sinus as well. Finding raises the possibility that the above  described left cerebral infarct may reflect a venous infarct due to  underlying occult cortical vein thrombosis.  Further assessed with  dedicated MRI could be performed for further evaluation. .   Medications I ordered medication ativan, keppra, and fluids for seizure.  Sources Additional history obtained from spouse, who reports no hx of seizures. Previous records obtained and reviewed show recent negative head CT at outside facility.  Consultants Appreciate Dr. Curly Shores, who will admit for suspected cerebral venous thrombosis.  Shared visit with Dr. Leonette Monarch.  Plan Admit to neuro-icu      Final Clinical Impression(s) / ED Diagnoses Final diagnoses:  Cerebral venous thrombosis    Rx / DC Orders ED Discharge Orders    None       Montine Circle, PA-C 06/17/20 8250    Fatima Blank, MD 06/17/20 1827

## 2020-06-18 ENCOUNTER — Inpatient Hospital Stay (HOSPITAL_COMMUNITY): Payer: BLUE CROSS/BLUE SHIELD

## 2020-06-18 DIAGNOSIS — R569 Unspecified convulsions: Secondary | ICD-10-CM

## 2020-06-18 DIAGNOSIS — R519 Headache, unspecified: Secondary | ICD-10-CM | POA: Diagnosis present

## 2020-06-18 DIAGNOSIS — E039 Hypothyroidism, unspecified: Secondary | ICD-10-CM | POA: Diagnosis present

## 2020-06-18 LAB — LIPID PANEL
Cholesterol: 174 mg/dL (ref 0–200)
HDL: 51 mg/dL (ref >40)
LDL Cholesterol: 99 mg/dL (ref 0–99)
Total CHOL/HDL Ratio: 3.4 RATIO
Triglycerides: 120 mg/dL (ref <150)
VLDL: 24 mg/dL (ref 0–40)

## 2020-06-18 LAB — BASIC METABOLIC PANEL
Anion gap: 8 (ref 5–15)
BUN: 7 mg/dL (ref 6–20)
CO2: 24 mmol/L (ref 22–32)
Calcium: 7.8 mg/dL — ABNORMAL LOW (ref 8.9–10.3)
Chloride: 107 mmol/L (ref 98–111)
Creatinine, Ser: 0.94 mg/dL (ref 0.61–1.24)
GFR calc Af Amer: >60 mL/min (ref >60)
GFR calc non Af Amer: >60 mL/min (ref >60)
Glucose, Bld: 105 mg/dL — ABNORMAL HIGH (ref 70–99)
Potassium: 3.6 mmol/L (ref 3.5–5.1)
Sodium: 139 mmol/L (ref 135–145)

## 2020-06-18 LAB — HEMOGLOBIN A1C
Hgb A1c MFr Bld: 5.5 % (ref 4.8–5.6)
Mean Plasma Glucose: 111.15 mg/dL

## 2020-06-18 LAB — CBC
HCT: 38.8 % — ABNORMAL LOW (ref 39.0–52.0)
Hemoglobin: 12.8 g/dL — ABNORMAL LOW (ref 13.0–17.0)
MCH: 27.3 pg (ref 26.0–34.0)
MCHC: 33 g/dL (ref 30.0–36.0)
MCV: 82.7 fL (ref 80.0–100.0)
Platelets: 206 10*3/uL (ref 150–400)
RBC: 4.69 MIL/uL (ref 4.22–5.81)
RDW: 13.7 % (ref 11.5–15.5)
WBC: 7.8 10*3/uL (ref 4.0–10.5)
nRBC: 0 % (ref 0.0–0.2)

## 2020-06-18 LAB — PROTEIN S, TOTAL: Protein S Ag, Total: 80 % (ref 60–150)

## 2020-06-18 LAB — PROTEIN C ACTIVITY: Protein C Activity: 82 % (ref 73–180)

## 2020-06-18 LAB — PROTEIN S, ANTIGEN, FREE: Protein S Ag, Free: 98 % (ref 61–136)

## 2020-06-18 LAB — HEPARIN LEVEL (UNFRACTIONATED)
Heparin Unfractionated: 0.3 IU/mL (ref 0.30–0.70)
Heparin Unfractionated: 0.28 IU/mL — ABNORMAL LOW (ref 0.30–0.70)

## 2020-06-18 LAB — PROTEIN S ACTIVITY: Protein S Activity: 69 % (ref 63–140)

## 2020-06-18 MED ORDER — DABIGATRAN ETEXILATE MESYLATE 150 MG PO CAPS: 150.0000 mg | ORAL_CAPSULE | Freq: Two times a day (BID) | ORAL | 0 refills | Status: DC

## 2020-06-18 MED ORDER — STROKE: EARLY STAGES OF RECOVERY BOOK
Status: AC
  Administered 2020-06-18: 1
  Filled 2020-06-18: qty 1

## 2020-06-18 MED ORDER — IOHEXOL 300 MG/ML  SOLN
100.0000 mL | Freq: Once | INTRAMUSCULAR | Status: AC | PRN
  Administered 2020-06-18: 100 mL via INTRAVENOUS

## 2020-06-18 MED ORDER — DABIGATRAN ETEXILATE MESYLATE 150 MG PO CAPS
150.0000 mg | ORAL_CAPSULE | Freq: Two times a day (BID) | ORAL | Status: DC
  Administered 2020-06-18: 150 mg via ORAL
  Filled 2020-06-18 (×2): qty 1

## 2020-06-18 MED ORDER — LEVETIRACETAM 750 MG PO TABS: 750.0000 mg | ORAL_TABLET | Freq: Two times a day (BID) | ORAL | 2 refills | Status: DC

## 2020-06-18 MED ORDER — DABIGATRAN ETEXILATE MESYLATE 150 MG PO CAPS: 150.0000 mg | ORAL_CAPSULE | Freq: Two times a day (BID) | ORAL | 2 refills | Status: DC

## 2020-06-18 MED ORDER — ACETAMINOPHEN 325 MG PO TABS: 650.0000 mg | ORAL_TABLET | ORAL | Status: AC | PRN

## 2020-06-18 MED ORDER — LEVETIRACETAM 750 MG PO TABS
750.0000 mg | ORAL_TABLET | Freq: Two times a day (BID) | ORAL | Status: DC
  Administered 2020-06-18: 750 mg via ORAL
  Filled 2020-06-18: qty 1

## 2020-06-18 NOTE — Discharge Summary (Addendum)
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   dabigatran 150 MG Caps capsule Commonly known as: PRADAXA Take 1 capsule (150 mg total) by mouth every 12 (twelve) hours.   HYDROcodone-acetaminophen 5-325 MG tablet Commonly known as: NORCO/VICODIN Take 0.5-1 tablets by mouth every 6 (six) hours as needed.   levETIRAcetam 750 MG tablet Commonly known as: KEPPRA Take 1 tablet (750 mg total) by mouth 2 (two) times daily.   levothyroxine 25 MCG tablet Commonly known as: SYNTHROID Take 25 mcg by mouth daily before breakfast.        LABORATORY STUDIES CBC    Component Value Date/Time   WBC 7.8 06/18/2020 0045   RBC 4.69 06/18/2020 0045   HGB 12.8 (L) 06/18/2020 0045   HCT 38.8 (L) 06/18/2020 0045   PLT 206 06/18/2020 0045   MCV 82.7 06/18/2020 0045   MCH 27.3 06/18/2020 0045   MCHC 33.0 06/18/2020 0045   RDW 13.7 06/18/2020 0045   LYMPHSABS 1.9 06/17/2020 0051   MONOABS 0.8 06/17/2020 0051   EOSABS 0.1 06/17/2020 0051   BASOSABS 0.1 06/17/2020 0051   CMP    Component Value  Date/Time   NA 139 06/18/2020 0045   K 3.6 06/18/2020 0045   CL 107 06/18/2020 0045   CO2 24 06/18/2020 0045   GLUCOSE 105 (H) 06/18/2020 0045   BUN 7 06/18/2020 0045   CREATININE 0.94 06/18/2020 0045   CALCIUM 7.8 (L) 06/18/2020 0045   PROT 6.3 (L) 06/17/2020 0051   ALBUMIN 3.7 06/17/2020 0051   AST 36 06/17/2020 0051   ALT 31 06/17/2020 0051   ALKPHOS 79 06/17/2020 0051   BILITOT 0.6 06/17/2020 0051   GFRNONAA >60 06/18/2020 0045   GFRAA >60 06/18/2020 0045   Lipid Panel    Component Value Date/Time   CHOL 174 06/18/2020 0045   TRIG 120 06/18/2020 0045   HDL 51 06/18/2020 0045   CHOLHDL 3.4 06/18/2020 0045   VLDL 24 06/18/2020 0045   LDLCALC 99 06/18/2020 0045   HgbA1C  Lab Results  Component Value Date   HGBA1C 5.5 06/18/2020   Urinalysis    Component Value Date/Time   COLORURINE YELLOW 06/17/2020 [place] 06/17/2020 0257   LABSPEC 1.028 06/17/2020 0257   PHURINE 5.0 06/17/2020 0257   GLUCOSEU NEGATIVE 06/17/2020 0257   HGBUR MODERATE (A) 06/17/2020 0257   BILIRUBINUR NEGATIVE 06/17/2020 0257   KETONESUR NEGATIVE 06/17/2020 0257   PROTEINUR NEGATIVE 06/17/2020 0257   NITRITE  NEGATIVE 06/17/2020 0257   LEUKOCYTESUR NEGATIVE 06/17/2020 0257   Urine Drug Screen     Component Value Date/Time   LABOPIA NONE DETECTED 06/17/2020 0257   COCAINSCRNUR NONE DETECTED 06/17/2020 0257   LABBENZ NONE DETECTED 06/17/2020 0257   AMPHETMU NONE DETECTED 06/17/2020 0257   THCU NONE DETECTED 06/17/2020 0257   LABBARB NONE DETECTED 06/17/2020 0257    Alcohol Level    Component Value Date/Time   ETH 10 (H) 06/17/2020 0051    SIGNIFICANT DIAGNOSTIC STUDIES EEG  Result Date: 06/17/2020 Lora Havens, MD     06/17/2020  1:52 PM Patient Name: Timothy [name] MRN: [id number] Epilepsy Attending: Lora Havens Referring Physician/Provider: Dr Lesleigh Noe Date: 06/17/2020 Duration: 23.44 mins Patient history: 59yo M with headache, venous sinus thrombosis  and seizures. EEG to evaluate for seizure. Level of alertness: Awake, asleep AEDs during EEG study: LEV Technical aspects: This EEG study was done with scalp electrodes positioned according to the 10-20 International system of electrode placement. Electrical activity was acquired at a sampling rate of 500Hz  and reviewed with a high frequency filter of 70Hz  and a low frequency filter of 1Hz . EEG data were recorded continuously and digitally stored. Description: The posterior dominant rhythm consists of 8 Hz activity of moderate voltage (25-35 uV) seen predominantly in posterior head regions, symmetric and reactive to eye opening and eye closing. Sleep was characterized by vertex waves, sleep spindles (12 to 14 Hz), maximal frontocentral region.  There is an excessive amount of 15 to 18 Hz, 2-3 uV beta activity with irregular morphology distributed symmetrically and diffusely.  Hyperventilation and photic stimulation were not performed.   IMPRESSION: This study is within normal limits. No seizures or epileptiform discharges were seen throughout the recording. [name]   CT ANGIO HEAD W OR WO CONTRAST  Result Date: 06/17/2020 CLINICAL DATA:  Initial evaluation for acute seizure like activity, possible stroke. EXAM: CT ANGIOGRAPHY HEAD AND NECK TECHNIQUE: Multidetector CT imaging of the head and neck was performed using the standard protocol during bolus administration of intravenous contrast. Multiplanar CT image reconstructions and MIPs were obtained to evaluate the vascular anatomy. Carotid stenosis measurements (when applicable) are obtained utilizing NASCET criteria, using the distal internal carotid diameter as the denominator. CONTRAST:  51mL OMNIPAQUE IOHEXOL 350 MG/ML SOLN COMPARISON:  None available. FINDINGS: CT HEAD FINDINGS Brain: Age-related cerebral atrophy. Focal wedge-shaped hypodensity seen involving the posterior left parietal lobe (series 5, image 24), concerning for evolving infarct. Few  small foci of associated hemorrhage seen at the deep aspect of this ischemic change, largest of which measures 6 mm. No associated mass effect. No other evidence for acute intracranial hemorrhage or large vessel territory infarct. No mass lesion or midline shift. No hydrocephalus or extra-axial fluid collection. Vascular: No hyperdense vessel. Skull: Scalp soft tissues and calvarium within normal limits. Sinuses: Mild scattered mucosal thickening noted within the ethmoidal air cells. Paranasal sinuses are otherwise clear. No mastoid effusion. Orbits: Globes and orbital soft tissues demonstrate no acute finding. Review of the MIP images confirms the above findings CTA NECK FINDINGS Aortic arch: Visualized aortic arch normal caliber with normal 3 vessel morphology. No hemodynamically significant stenosis or other abnormality seen about the origin of the great vessels. Right carotid system: Right common and internal carotid arteries widely patent without stenosis, dissection or occlusion. Left carotid system: Left common and internal carotid arteries widely patent without stenosis, dissection or occlusion. Vertebral arteries: Both vertebral arteries arise from the subclavian arteries. Atheromatous plaque  at the origins of both vertebral arteries with no more than mild stenosis. Vertebral arteries otherwise widely patent without stenosis, dissection or occlusion. Skeleton: No suspicious osseous lesions. Mild multilevel cervical spondylosis, most pronounced at C5-6 and C6-7. Minimal wedging at the superior endplates of T3 and T5 noted, favored to be chronic. Calcification at the longus coli tendon without associated inflammation to suggest acute tendinitis. Other neck: No other acute soft tissue abnormality within the neck. No mass lesion or adenopathy. Upper chest: Mild atelectatic changes noted within the visualized lungs. Visualized upper chest demonstrates no other acute finding. Review of the MIP images confirms the  above findings CTA HEAD FINDINGS Anterior circulation: Petrous segments widely patent bilaterally. Minimal atheromatous change within the right carotid siphon without flow-limiting stenosis. Left carotid siphon widely patent. A1 segments patent. Normal anterior communicating artery complex. Anterior cerebral arteries patent to their distal aspects without stenosis. No M1 stenosis or occlusion. Normal MCA bifurcations. Distal MCA branches well perfused and symmetric. Posterior circulation: Both vertebral arteries patent to the vertebrobasilar junction without stenosis. Both picas patent. Basilar patent to its distal aspect without stenosis. Superior cerebral arteries patent bilaterally. Both PCAs primarily supplied via the basilar well perfused to their distal aspects. Venous sinuses: Apparent focal filling defects seen throughout the superior sagittal sinus to the torcula. Extension into the right transverse and sigmoid sinuses to the level of the jugular bulb. 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 Left transverse and sigmoid sinuses appear patent as does the proximal left internal jugular vein. Possible additional partial filling defect noted within the straight sinus. Anatomic variants: None significant.  No intracranial aneurysm. Review of the MIP images confirms the above findings IMPRESSION: CT HEAD IMPRESSION: 1. Abnormal hypodensity involving the left parietal cortex, concerning for acute ischemia. Few small foci of associated hemorrhage as above. 2. No other acute intracranial abnormality. CTA HEAD AND NECK IMPRESSION: 1. Negative CTA for large vessel occlusion. No hemodynamically significant or correctable stenosis about the major arterial vasculature of the head and neck. 2. Long segment filling defects within the superior sagittal, right transverse, and sigmoid sinuses, concerning for dural sinus thrombosis. Possible partial filling defect within the straight sinus as well. Finding raises the  possibility that the above described left cerebral infarct may reflect a venous infarct due to underlying occult cortical vein thrombosis. Further assessed with dedicated MRI could be performed for further evaluation. Critical Value/emergent results were discussed by telephone at the time of interpretation on 06/17/2020 at 3 a.m. to provider SRISHTI BHAGAT ; [address] , who verbally acknowledged these results. Electronically Signed   By: Jeannine Boga M.D.   On: 06/17/2020 03:51   CT Angio Neck W and/or Wo Contrast  Result Date: 06/17/2020 CLINICAL DATA:  Initial evaluation for acute seizure like activity, possible stroke. EXAM: CT ANGIOGRAPHY HEAD AND NECK TECHNIQUE: Multidetector CT imaging of the head and neck was performed using the standard protocol during bolus administration of intravenous contrast. Multiplanar CT image reconstructions and MIPs were obtained to evaluate the vascular anatomy. Carotid stenosis measurements (when applicable) are obtained utilizing NASCET criteria, using the distal internal carotid diameter as the denominator. CONTRAST:  52mL OMNIPAQUE IOHEXOL 350 MG/ML SOLN COMPARISON:  None available. FINDINGS: CT HEAD FINDINGS Brain: Age-related cerebral atrophy. Focal wedge-shaped hypodensity seen involving the posterior left parietal lobe (series 5, image 24), concerning for evolving infarct. Few small foci of associated hemorrhage seen at the deep aspect of this ischemic change, largest of which measures 6 mm. No associated mass  effect. No other evidence for acute intracranial hemorrhage or large vessel territory infarct. No mass lesion or midline shift. No hydrocephalus or extra-axial fluid collection. Vascular: No hyperdense vessel. Skull: Scalp soft tissues and calvarium within normal limits. Sinuses: Mild scattered mucosal thickening noted within the ethmoidal air cells. Paranasal sinuses are otherwise clear. No mastoid effusion. Orbits: Globes and orbital soft tissues  demonstrate no acute finding. Review of the MIP images confirms the above findings CTA NECK FINDINGS Aortic arch: Visualized aortic arch normal caliber with normal 3 vessel morphology. No hemodynamically significant stenosis or other abnormality seen about the origin of the great vessels. Right carotid system: Right common and internal carotid arteries widely patent without stenosis, dissection or occlusion. Left carotid system: Left common and internal carotid arteries widely patent without stenosis, dissection or occlusion. Vertebral arteries: Both vertebral arteries arise from the subclavian arteries. Atheromatous plaque at the origins of both vertebral arteries with no more than mild stenosis. Vertebral arteries otherwise widely patent without stenosis, dissection or occlusion. Skeleton: No suspicious osseous lesions. Mild multilevel cervical spondylosis, most pronounced at C5-6 and C6-7. Minimal wedging at the superior endplates of T3 and T5 noted, favored to be chronic. Calcification at the longus coli tendon without associated inflammation to suggest acute tendinitis. Other neck: No other acute soft tissue abnormality within the neck. No mass lesion or adenopathy. Upper chest: Mild atelectatic changes noted within the visualized lungs. Visualized upper chest demonstrates no other acute finding. Review of the MIP images confirms the above findings CTA HEAD FINDINGS Anterior circulation: Petrous segments widely patent bilaterally. Minimal atheromatous change within the right carotid siphon without flow-limiting stenosis. Left carotid siphon widely patent. A1 segments patent. Normal anterior communicating artery complex. Anterior cerebral arteries patent to their distal aspects without stenosis. No M1 stenosis or occlusion. Normal MCA bifurcations. Distal MCA branches well perfused and symmetric. Posterior circulation: Both vertebral arteries patent to the vertebrobasilar junction without stenosis. Both picas  patent. Basilar patent to its distal aspect without stenosis. Superior cerebral arteries patent bilaterally. Both PCAs primarily supplied via the basilar well perfused to their distal aspects. Venous sinuses: Apparent focal filling defects seen throughout the superior sagittal sinus to the torcula. Extension into the right transverse and sigmoid sinuses to the level of the jugular bulb. 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 Left transverse and sigmoid sinuses appear patent as does the proximal left internal jugular vein. Possible additional partial filling defect noted within the straight sinus. Anatomic variants: None significant.  No intracranial aneurysm. Review of the MIP images confirms the above findings IMPRESSION: CT HEAD IMPRESSION: 1. Abnormal hypodensity involving the left parietal cortex, concerning for acute ischemia. Few small foci of associated hemorrhage as above. 2. No other acute intracranial abnormality. CTA HEAD AND NECK IMPRESSION: 1. Negative CTA for large vessel occlusion. No hemodynamically significant or correctable stenosis about the major arterial vasculature of the head and neck. 2. Long segment filling defects within the superior sagittal, right transverse, and sigmoid sinuses, concerning for dural sinus thrombosis. Possible partial filling defect within the straight sinus as well. Finding raises the possibility that the above described left cerebral infarct may reflect a venous infarct due to underlying occult cortical vein thrombosis. Further assessed with dedicated MRI could be performed for further evaluation. Critical Value/emergent results were discussed by telephone at the time of interpretation on 06/17/2020 at 3 a.m. to provider SRISHTI BHAGAT ; [address] , who verbally acknowledged these results. Electronically Signed   By: Pincus Badder.D.  On: 06/17/2020 03:51   MR BRAIN W WO CONTRAST  Result Date: 06/17/2020 CLINICAL DATA:  Seizure with abnormal  neuro exam. EXAM: MRI HEAD WITHOUT AND WITH CONTRAST TECHNIQUE: Multiplanar, multiecho pulse sequences of the brain and surrounding structures were obtained without and with intravenous contrast. CONTRAST:  49mL GADAVIST GADOBUTROL 1 MMOL/ML IV SOLN COMPARISON:  Head CT and CTA from earlier today FINDINGS: Brain: Dural sinus thrombosis extending throughout the superior sagittal sinus, right transverse sinus, and right sigmoid sinus. There is Trolard involvement on the left where there is parietal edema without infarct. Subcentimeter parenchymal hemorrhage are seen at the level of left parietal involvement, best seen on head CT coronal reformats. Congested vessels are also seen along the cortex at this level on gradient imaging. No underlying vascular malformation is seen by CTA. No hydrocephalus, mass, or collection. 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 There is Trolard involvement on the left where there is parietal edema and subcentimeter hemorrhages. Electronically Signed   By: Monte Fantasia M.D.   On: 06/17/2020 05:06   CT CHEST ABDOMEN PELVIS W CONTRAST  Result Date: 06/18/2020 CLINICAL DATA:  Seizure hypercoagulable state EXAM: CT CHEST, ABDOMEN, AND PELVIS WITH CONTRAST TECHNIQUE: Multidetector CT imaging of the chest, abdomen and pelvis was performed following the standard protocol during bolus administration of intravenous contrast. CONTRAST:  150mL OMNIPAQUE IOHEXOL 300 MG/ML  SOLN COMPARISON:  MRI 06/17/2020 FINDINGS: CT CHEST FINDINGS Cardiovascular: Nonaneurysmal aorta. Normal heart size. No pericardial effusion Mediastinum/Nodes: No enlarged mediastinal, hilar, or axillary lymph nodes. Thyroid gland, trachea, and esophagus demonstrate no significant findings. Lungs/Pleura: Streaky densities in the posterior  lower lobes likely atelectasis. No pleural effusion or pneumothorax. Musculoskeletal: No chest wall mass or suspicious bone lesions identified. CT ABDOMEN PELVIS FINDINGS Hepatobiliary: No focal liver abnormality is seen. No gallstones, gallbladder wall thickening, or biliary dilatation. Pancreas: Unremarkable. No pancreatic ductal dilatation or surrounding inflammatory changes. Spleen: Normal in size without focal abnormality. Adrenals/Urinary Tract: Adrenal glands are unremarkable. Kidneys are normal, without renal calculi, focal lesion, or hydronephrosis. Bladder is unremarkable. Stomach/Bowel: Stomach is within normal limits. Appendix appears normal. No evidence of bowel wall thickening, distention, or inflammatory changes. Vascular/Lymphatic: Mild atherosclerosis. Ectatic right common iliac artery measuring 1.7 cm. No suspicious adenopathy. Reproductive: Mild prostate calcification. Other: Negative for free air or free fluid. Surgical clips in the bilateral lower quadrants. Musculoskeletal: Chest 1 IMPRESSION: No CT evidence for acute intrathoracic, intra-abdominal, or intrapelvic abnormality. Electronically Signed   By: Donavan Foil M.D.   On: 06/18/2020 03:44      HISTORY OF PRESENT ILLNESS Mathayus Stanbery is a 59 y.o. male with a past medical history significant for hyperlipidemia (diet controlled with 1 to 2 glasses of red wine daily), and hypothyroidism on Synthroid.   On June 06, 2020 ([place]) while in [place] on a business trip he developed a new onset headache on the right side, starting at the base of the right neck and radiating up to the top of his head, and has ranged in intensity from 10/10 to 5/10, with some improvement with prednisone or Advil.  He denies any associated nausea, vomiting, light sensitivity, sound sensitivity, emesis.  He has had neck stiffness.  The headache wakes him up from sleep but is not worse in the morning; in fact he feels that it is worse over the course of the day.  It does not improve with laying down.  He does not have pulsatile tinnitus or transient loss of vision with position changes.  He denies diplopia. Due to high rates of COVID-19 in the facility he was at an [place], he got Covid tested which was negative.  He has not had any respiratory symptoms.    Due to persistent severe headache he presented to an outside ED.  They were concerned for trigeminal neuralgia and started him on a prednisone taper and a 7-day course of famciclovir.   After discharge from the outside ED 1 week ago on 9/5 he had an episode of confusion where he was trying to open the car door but instead was polishing the windshield and making movements that look like he was picking spider webs off of the car door.  He had another episode of confusion on Monday morning where he could not figure out how to set his home security alarm, and on Tuesday where he could not use his computer or text.  This confusion resolved, however on the day of admission at 10 PM while his wife was massaging his back he suddenly yelled out, arms crossed and clot in tonic position partially up and he became rigid with enlarged pupils and slobbering.  This episode resolved by the time EMS arrived and he had post event confusion.  He had a nearly identical event in the ED that self resolved after 30 seconds.  Neurology was consulted. He and his wife deny any fevers, chills, urinary or bladder issues, numbness or focal weakness. Of note his family history is entirely unknown as he was adopted at age 74; therefore his early birth history is unknown as well.  However there is no history of head trauma or prior meningitis or other intracranial processes. The patient completed his course of famciclovir, briefly self increased his levothyroxine dose to 50 mcg in case low thyroid levels were contributing to his headache, but then reduce the dose back down to 25 mcg daily he started to have a headache.   CT head reveals a  cerebral venous thrombosis   HOSPITAL COURSE Mr. Va Broadwell is a 59 y.o. male with history of HLD and hypothryoidism presenting with severe HA and neck pain,  initially seen 9/2 in [place]. Developed several episodes of confusion and presented to [facility] in [place] on 9/5 where CT head was negative and treated for trigeminal neuralgia w/ famciclovir and prednisone taper.  Now presenting with full body seizure. CT shows sinus thrombosis.      Dural sinus thrombosis   CT head L parietal hypodensity w/ few foci scattered hemorrhage . CTA head & neck no LVO. No arterial stenosis. Dural sinus thrombosis superior sagittal, R transverse and sigmoid sinuses w/ partial filling defect straight sinus. MRI  Extensive dural sinus thrombosis (superior sagittal, R transverse, R sigmoid sinuses). L parietal edema w/ tiny hemorrhages. LDL 99 HgbA1c 5.5 Hypercoagulable labs pending   Lactic acid 9.6  Patient has not been vaccinated for COVID. Dr. Leonie Man encouraged both he and his wife to take vaccine. They want to think and talk about it.  VTE prophylaxis - IV heparin->Pradaxa No antithrombotic prior to admission, transitioned from heparin IV to Pradaxa based on RE-SPECT-CVT results. Will likely continue for 30mo-1 year, based on testing results.  Therapy recommendations:  OP SLP, OP PT, OP  OT  Disposition:  Return home Encourage PO fluids, avoid dehydration May return to work in 1 month. Avoid strenuous activity, blood thinner precautions.  Headache D/t sinus thrombosis On tylenol prn Add tramadol prn   Seizure EEG no sz Keppra load, now on 500 bid   Continue Keppra at d/c Patient advised according to [place] law, pt can not drive until seizure free for 6 months and under physician's care.      Hyperlipidemia Home meds:  None, diet controlled LDL 99 Discussed w/ quality staff, does not fall under stroke dx, so will continue w/ non-stroke LDL goal of < 100, no statin at d/c   Other Stroke Risk  Factors ETOH use, alcohol level 10, advised to drink no more than 2 drink(s) a day   Other Active Problems Hypothyroidism on synthroid AKI, Cre 1.26->0.94 - resolved   DISCHARGE EXAM Blood pressure 115/81, pulse 61, temperature 98.7 F (37.1 C), temperature source Oral, resp. rate 12, height 5\' 7"  (1.702 m), weight 63.5 kg, SpO2 93 %. Pleasant middle-aged Caucasian male not in distress. . Afebrile. Head is nontraumatic. Neck is supple without bruit.    Cardiac exam no murmur or gallop. Lungs are clear to auscultation. Distal pulses are well felt. Neurological Exam : Awake alert oriented to time place and person.  Diminished attention, registration and recall.  No aphasia apraxia or dysarthria.  Extraocular movements are full range without nystagmus.  Blinks to threat bilaterally.  Fundi not visualized.  Face is symmetric without weakness.  Tongue midline.  Motor system exam symmetric upper and lower extremity strength without focal weakness.  Symmetric deep tendon reflexes.  Sensation is preserved.  Coordination is accurate.  Gait not tested.  Discharge Diet   Regular thin liquids  DISCHARGE PLAN Disposition:  Return home Outpatient speech, occupational and physical therapy  Continue Keppra Seizure precautions, do not drive x 6 mo May return to work in 1 month Avoid strenuous activity  Avoid dehydration, fluids encouraged Pradaxa (dabigatran) twice a day for sinus thrombosis treatment for 63mo-1 yr Ongoing stroke risk factor control by Primary Care Physician at time of discharge Follow-up PCP Timothy Birchwood, NP in 2 weeks. Follow-up in [facility] in 4 weeks, office to schedule an appointment.   45 minutes were spent preparing discharge.  Burnetta Sabin, MSN, APRN, ANVP-BC, AGPCNP-BC Advanced Practice Stroke Nurse [place] for Schedule & Pager information 06/18/2020 1:41 PM  I have personally obtained history,examined  this patient, reviewed notes, independently viewed imaging studies, participated in medical decision making and plan of care.ROS completed by me personally and pertinent positives fully documented  I have made any additions or clarifications directly to the above note. Agree with note above.    Antony Contras, MD Medical Director [facility] Pager: [phone] 06/18/2020 3:05 PM

## 2020-06-18 NOTE — Plan of Care (Signed)
Patient and wife educated and given handouts. Patient verbally expresses understanding of diagnosis and treatment plan.

## 2020-06-18 NOTE — Progress Notes (Signed)
Occupational Therapy Treatment Patient Details Name: Timothy [name] MRN: [id number] DOB: [birth year] Today's Date: 06/18/2020    History of present illness 59 yo admitted with seizure. Pt with onset of HA while traveling in [place] 9/2 involing Rt neck and head with continued pain and treated for trigeminal neuralgia. Pt with episodes of confusion on 9/5,6, and 7. PMhx: thyroid disease   OT comments  Pt seen for additional visit as pt initially requesting "mental/brain exercises" to perform at home. Discussed and educated in reason for outpt therapy recommendations as well as benefits for attending outpatient therapies (as a means to "workout" for his brain). Pt verbalizing concerns over cost of attending outpt therapy but does verbalize being agreeable to at least trialling/attending outpt services as discussion progressed and further education provided. Educated pt in additional activities he can participate in at home to further challenge his thinking in addition to outpatient therapies (and as precursor to attending outpt after initial discharge home) with pt verbalizing understanding. Feel POC remains appropriate at this time. Will continue to follow acutely.   Follow Up Recommendations  Outpatient OT (outpatient neuro OT)    Equipment Recommendations  None recommended by OT          Precautions / Restrictions Precautions Precautions: Fall Restrictions Weight Bearing Restrictions: No       Mobility Bed Mobility           General bed mobility comments: not performed this session                      ADL either performed or assessed with clinical judgement   ADL                                         General ADL Comments: educated pt in benefits of outpt therapies to further address cognition as pt initially resistive given outpt costs; pt appears more agreeable with education provided. education in additional tasks he could perform on his own  to challenge his mind including playing cards, puzzles such as sudoku, model car building as cars are his primary hobby, reading a recipe/following, etc                        Cognition Arousal/Alertness: Awake/alert Behavior During Therapy: Impulsive Overall Cognitive Status: Impaired/Different from baseline Area of Impairment: Safety/judgement                         Safety/Judgement: Decreased awareness of safety     General Comments: revisited pt for further education on importance of going to outpt therapies post discharge given ongoing cognitive deficits         Exercises Exercises: Other exercises Other Exercises Other Exercises: gave general activities pt could perform to challenge cognition at home, encouraged attenting outpt therapies for higher level challenges as he will benefit    Shoulder Instructions       General Comments VSS    Pertinent Vitals/ Pain       Pain Assessment: Faces Pain Score: 8  (L knee pain with extension, 2/10 at rest or flexion) Faces Pain Scale: No hurt Pain Location: L knee and L shld Pain Descriptors / Indicators: Sharp Pain Intervention(s): Monitored during session  Home Living  Prior Functioning/Environment              Frequency  Min 2X/week        Progress Toward Goals  OT Goals(current goals can now be found in the care plan section)  Progress towards OT goals: Progressing toward goals  Acute Rehab OT Goals Patient Stated Goal: return to work OT Goal Formulation: With patient Time For Goal Achievement: 07/02/20 Potential to Achieve Goals: Good ADL Goals Pt Will Perform Grooming: with modified independence;standing Pt Will Perform Lower Body Dressing: with modified independence;sit to/from stand Pt/caregiver will Perform Home Exercise Program: Increased ROM;Left upper extremity;With Supervision;With written HEP provided;Increased  strength Additional ADL Goal #1: Pt will demonstrate anticipatory awareness during ADL/functional task.  Plan Discharge plan remains appropriate    Co-evaluation                 AM-PAC OT "6 Clicks" Daily Activity     Outcome Measure   Help from another person eating meals?: None Help from another person taking care of personal grooming?: A Little Help from another person toileting, which includes using toliet, bedpan, or urinal?: A Little Help from another person bathing (including washing, rinsing, drying)?: A Little Help from another person to put on and taking off regular upper body clothing?: None Help from another person to put on and taking off regular lower body clothing?: A Little 6 Click Score: 20    End of Session    OT Visit Diagnosis: Unsteadiness on feet (R26.81);Other symptoms and signs involving cognitive function   Activity Tolerance Patient tolerated treatment well   Patient Left in bed;with call bell/phone within reach;with family/visitor present   Nurse Communication Mobility status        Time: 4401-0272 OT Time Calculation (min): 8 min  Charges: OT General Charges $OT Visit: 1 Visit  Lou Cal, OT Acute Rehabilitation Services Pager [phone] Office [phone]    Raymondo Band 06/18/2020, 3:53 PM

## 2020-06-18 NOTE — TOC Initial Note (Signed)
Transition of Care [facility]) - Initial/Assessment Note    Patient Details  Name: Timothy [name] MRN: [id number] Date of Birth: 1961/09/18  Transition of Care [facility]) CM/SW Contact:    Marilu Favre, RN Phone Number: 06/18/2020, 10:55 AM  Clinical Narrative:                  Spoke to patient and wife Timothy [name] at bedside. Confirmed face sheet information.   Discussed recommendations for OP PT,OT, and SP. Patient and wife in agreement. Referral placed for Neuro rehab on [address]  Expected Discharge Plan: Home/Self Care Barriers to Discharge: Continued Medical Work up   Patient Goals and CMS Choice Patient states their goals for this hospitalization and ongoing recovery are:: to return home CMS Medicare.gov Compare Post Acute Care list provided to:: Patient Choice offered to / list presented to : Patient  Expected Discharge Plan and Services Expected Discharge Plan: Home/Self Care   Discharge Planning Services: CM Consult Post Acute Care Choice:  (OP therapy) Living arrangements for the past 2 months: Single Family Home                 DME Arranged: N/A         HH Arranged: NA          Prior Living Arrangements/Services Living arrangements for the past 2 months: Single Family Home Lives with:: Spouse Patient language and need for interpreter reviewed:: Yes Do you feel safe going back to the place where you live?: Yes      Need for Family Participation in Patient Care: Yes (Comment) Care giver support system in place?: Yes (comment)   Criminal Activity/Legal Involvement Pertinent to Current Situation/Hospitalization: No - Comment as needed  Activities of Daily Living Home Assistive Devices/Equipment: None ADL Screening (condition at time of admission) Patient's cognitive ability adequate to safely complete daily activities?: Yes Is the patient deaf or have difficulty hearing?: No Does the patient have difficulty seeing, even when wearing glasses/contacts?: Yes Does  the patient have difficulty concentrating, remembering, or making decisions?: No Patient able to express need for assistance with ADLs?: No Does the patient have difficulty dressing or bathing?: No Independently performs ADLs?: Yes (appropriate for developmental age) Does the patient have difficulty walking or climbing stairs?: No Weakness of Legs: None Weakness of Arms/Hands: None  Permission Sought/Granted   Permission granted to share information with : Yes, Verbal Permission Granted  Share Information with NAME: Timothy [name] [phone]  Permission granted to share info w AGENCY: Neuro rehab        Emotional Assessment Appearance:: Appears stated age Attitude/Demeanor/Rapport: Engaged Affect (typically observed): Accepting Orientation: : Oriented to Situation, Oriented to  Time, Oriented to Place, Oriented to Self Alcohol / Substance Use: Illicit Drugs Psych Involvement: No (comment)  Admission diagnosis:  Cerebral venous thrombosis [G08] Patient Active Problem List   Diagnosis Date Noted  . Cerebral venous thrombosis 06/17/2020  . Cerebral thrombosis with cerebral infarction 06/17/2020  . Cerebral embolism with cerebral infarction 06/17/2020  . Subarachnoid hemorrhage 06/17/2020  . Intracerebral hemorrhage 06/17/2020   PCP:  Mackey Birchwood, NP Pharmacy:   [id number] - [place], [place] [address] Phone: [phone] Fax: [phone]  [facility] [place] [place] - [address] [place] [address] Phone: [phone] Fax: [phone]     Social Determinants of Health (SDOH) Interventions  Readmission Risk Interventions No flowsheet data found.

## 2020-06-18 NOTE — Discharge Instructions (Signed)
Information on my medicine - Pradaxa (dabigatran)  This medication education was reviewed with me or my healthcare representative as part of my discharge preparation.  Why was Pradaxa prescribed for you? Pradaxa was prescribed to treat blood clots that may have been found and to reduce the risk of them occurring again.  Pradaxa will take the place of the injectable anticoagulant medication you have been receiving for this condition.  What do you Need to know about PradAXa? Take your Pradaxa TWICE DAILY - one capsule in the morning and one tablet in the evening with or without food.  It would be best to take the doses about the same time each day.  The capsules should not be broken, chewed or opened - they must be swallowed whole.  Do not store Pradaxa in other medication containers - once the bottle is opened the Pradaxa should be used within FOUR months; throw away any capsules that havent been by that time.  Take Pradaxa exactly as prescribed by your doctor.  DO NOT stop taking Pradaxa without talking to the doctor who prescribed the medication.  Refill your prescription before you run out.  After discharge, you should have regular check-up appointments with your healthcare provider that is prescribing your Pradaxa.  In the future your dose may need to be changed if your kidney function or weight changes by a significant amount.  What do you do if you miss a dose? If you miss a dose, take it as soon as you remember on the same day.  If your next dose is less than 6 hours away, skip the missed dose.  Do not take two doses of PRADAXA at the same time.  Important Safety Information A possible side effect of Pradaxa is bleeding. You should call your healthcare provider right away if you experience any of the following: ? Bleeding from an injury or your nose that does not stop. ? Unusual colored urine (red or dark brown) or unusual colored stools (red or black). ? Unusual bruising  for unknown reasons. ? A serious fall or if you hit your head (even if there is no bleeding).  Some medicines may interact with Pradaxa and might increase your risk of bleeding or clotting while on Pradaxa. To help avoid this, consult your healthcare provider or pharmacist prior to using any new prescription or non-prescription medications, including herbals, vitamins, non-steroidal anti-inflammatory drugs (NSAIDs) and supplements.  This website has more information on Pradaxa (dabigatran): www.[employer].cz.

## 2020-06-18 NOTE — Progress Notes (Signed)
ANTICOAGULATION CONSULT NOTE  Pharmacy Consult for heparin  Indication: venous thrombosis  No Known Allergies  Patient Measurements: Wt= 63.5kg  Vital Signs: Temp: 99.2 F (37.3 C) (09/14 0000) Temp Source: Oral (09/14 0000) BP: 126/91 (09/13 2000) Pulse Rate: 60 (09/13 2000)  Labs: Recent Labs    06/17/20 0051 06/17/20 1447 06/18/20 0045  HGB 15.4  --  12.8*  HCT 48.4  --  38.8*  PLT 269  --  206  HEPARINUNFRC  --  0.18* 0.30  CREATININE 1.26*  --  0.94  CKTOTAL 210  --   --     Estimated Creatinine Clearance: 76.9 mL/min (by C-G formula based on SCr of 0.94 mg/dL).   Assessment: 59 yo male here w/ seizure and noted with extensive dural sinus thrombosis. Pharmacy consulted to dose heparin (low dose, no bolus). No anticoagulants noted PTA.  Heparin level therapeutic (0.3) on gtt at 950 units/hr. No bleeding noted.  Goal of Therapy:  Heparin level 0.3-0.5 units/ml, no boluses Monitor platelets by anticoagulation protocol: Yes   Plan:  Continue heparin at 950 units/hr Check 6hr confirmatory heparin level  Sherlon Handing, PharmD, BCPS Please see amion for complete clinical pharmacist phone list 06/18/2020 2:03 AM

## 2020-06-18 NOTE — Progress Notes (Signed)
ANTICOAGULATION CONSULT NOTE  Pharmacy Consult for heparin > dabigatran Indication: cerebral dural sinus venous thrombosis  No Known Allergies  Patient Measurements: Wt= 63.5kg  Vital Signs: Temp: 99.3 F (37.4 C) (09/14 0400) Temp Source: Oral (09/14 0400) BP: 115/79 (09/14 0600) Pulse Rate: 68 (09/14 0600)  Labs: Recent Labs    06/17/20 0051 06/17/20 1447 06/18/20 0045  HGB 15.4  --  12.8*  HCT 48.4  --  38.8*  PLT 269  --  206  HEPARINUNFRC  --  0.18* 0.30  CREATININE 1.26*  --  0.94  CKTOTAL 210  --   --     Estimated Creatinine Clearance: 76.9 mL/min (by C-G formula based on SCr of 0.94 mg/dL).   Assessment: 59 yo male here w/ seizure and noted with extensive dural sinus thrombosis. Pharmacy consulted to dose heparin (low dose, no bolus). No anticoagulants noted PTA.  Pharmacy consulted by Neuro to transition to dabigatran based on evidence from the RE-SPECT CVT trial showing dabigatran and warfarin to be similar in safety and efficacy in this setting. Discussed with Dr. Leonie Man that inclusion in this study required patients to be stable after completing 5-15 days of parenteral heparin. This patient is currently on day #2 of heparin. Per discussion with Dr. Leonie Man, he is aware of the study inclusion criteria but does not feel 5 full days of parenteral heparin is necessary in this case due to patient's overall stable condition and ability to be discharged today. He would like to start dabigatran now. CBC stable. No bleeding issues noted.  Goal of Therapy:  VTE/CVT treatment/prevention Monitor platelets by anticoagulation protocol: Yes   Plan:  D/c heparin at time of 1st dose of dabigatran 150mg  PO BID - discussed plan with RN Monitor CBC, s/sx bleeding   Arturo Morton, PharmD, BCPS Please check AMION for all [facility] contact numbers Clinical Pharmacist 06/18/2020 8:41 AM

## 2020-06-18 NOTE — Evaluation (Signed)
Occupational Therapy Evaluation Patient Details Name: Timothy [name] MRN: [id number] DOB: 03/08/1961 Today's Date: 06/18/2020    History of Present Illness 59 yo admitted with seizure. Pt with onset of HA while traveling in [place] 9/2 involing Rt neck and head with continued pain and treated for trigeminal neuralgia. Pt with episodes of confusion on 9/5,6, and 7. PMhx: thyroid disease   Clinical Impression   This 59 y/o male presents with the above. PTA pt very independent with ADL, iADL and functional mobility. Pt presents seated in recliner pleasant and willing to participate in therapy session. Pt requiring overall minguard assist for mobility tasks without AD (both in room and hallway), up to minguard assist for ADL. VSS throughout on RA. Pt guarded with mobility tasks given reports of pain/discomfort in bil knees and L shoulder from seizure. Pt also noted to have impaired cognition including decreased awareness and STM deficits. Administered Short Blessed Test with pt receiving score of 13 (indicative of "impairment consistent with dementia"). Pt to benefit from continued acute OT services and currently recommend follow up outpatient neuro OT services to further address cognition, LUE limitations and to maximize his overall safety and independence with ADL and mobility.     Follow Up Recommendations  Outpatient OT (outpt neuro OT)    Equipment Recommendations  None recommended by OT           Precautions / Restrictions Precautions Precautions: Fall Restrictions Weight Bearing Restrictions: No      Mobility Bed Mobility               General bed mobility comments: OOB upon arrival  Transfers Overall transfer level: Needs assistance Equipment used: None Transfers: Sit to/from Stand Sit to Stand: Min guard         General transfer comment: guarding for lines, safety    Balance Overall balance assessment: Mild deficits observed, not formally tested                                          ADL either performed or assessed with clinical judgement   ADL Overall ADL's : Needs assistance/impaired Eating/Feeding: Modified independent;Sitting   Grooming: Wash/dry face;Oral care;Supervision/safety;Standing Grooming Details (indicate cue type and reason): standing at sink in bathroom Upper Body Bathing: Supervision/ safety;Sitting   Lower Body Bathing: Supervison/ safety;Sit to/from stand   Upper Body Dressing : Modified independent;Sitting   Lower Body Dressing: Min guard;Sit to/from stand Lower Body Dressing Details (indicate cue type and reason): pt able to don socks seated in recliner  Toilet Transfer: Min guard;Ambulation Toilet Transfer Details (indicate cue type and reason): simulated via transfer to/from recliner, room and hallway level mobility without AD  Toileting- Clothing Manipulation and Hygiene: Min guard;Sit to/from stand       Functional mobility during ADLs: Min guard       Vision Baseline Vision/History: Wears glasses Wears Glasses: At all times Patient Visual Report: No change from baseline Vision Assessment?: No apparent visual deficits     Perception     Praxis      Pertinent Vitals/Pain Pain Assessment: Faces Faces Pain Scale: Hurts little more Pain Location: L shoulder and bil knees  Pain Descriptors / Indicators: Discomfort;Grimacing;Guarding Pain Intervention(s): Limited activity within patient's tolerance;Monitored during session     Hand Dominance Right   Extremity/Trunk Assessment Upper Extremity Assessment Upper Extremity Assessment: LUE deficits/detail LUE Deficits / Details: limited shoulder  ROM due to pain/stiffness LUE Coordination: decreased gross motor   Lower Extremity Assessment Lower Extremity Assessment: Defer to PT evaluation   Cervical / Trunk Assessment Cervical / Trunk Assessment: Normal   Communication Communication Communication: No difficulties   Cognition  Arousal/Alertness: Awake/alert Behavior During Therapy: WFL for tasks assessed/performed Overall Cognitive Status: Impaired/Different from baseline Area of Impairment: Memory;Safety/judgement                     Memory: Decreased short-term memory   Safety/Judgement: Decreased awareness of safety;Decreased awareness of deficits     General Comments: pt minimally receptive to cognitive assessment and recommendations (including education on no driving for 6 months, easing back into daily activities as he is typically very active/works out). Administered Short Blessed Test with pt receiving score of 13 (indicative of "impairment consistent with dementia"     General Comments  VSS on RA, educated both pt/pt's spouse on general return to activities and no driving initially (for first 6 months)    Exercises     Shoulder Instructions      Home Living Family/patient expects to be discharged to:: Private residence Living Arrangements: Spouse/significant other Available Help at Discharge: Family;Available 24 hours/day Type of Home: House Home Access: Stairs to enter [employer] of Steps: 3   Home Layout: Two level;Bed/bath upstairs     Bathroom Shower/Tub: Occupational psychologist: Standard     Home Equipment: Shower seat - built in          Prior Functioning/Environment Level of Independence: Independent        Comments: pt works, is active and enjoys fast cars        OT Problem List: Decreased strength;Decreased range of motion;Decreased activity tolerance;Impaired balance (sitting and/or standing);Decreased cognition;Decreased safety awareness;Impaired UE functional use;Pain      OT Treatment/Interventions: Self-care/ADL training;Therapeutic exercise;Energy conservation;DME and/or AE instruction;Therapeutic activities;Cognitive remediation/compensation;Patient/family education;Balance training    OT Goals(Current goals can be found in the  care plan section) Acute Rehab OT Goals Patient Stated Goal: return to work OT Goal Formulation: With patient Time For Goal Achievement: 07/02/20 Potential to Achieve Goals: Good  OT Frequency: Min 2X/week   Barriers to D/C:            Co-evaluation              AM-PAC OT "6 Clicks" Daily Activity     Outcome Measure Help from another person eating meals?: None Help from another person taking care of personal grooming?: A Little Help from another person toileting, which includes using toliet, bedpan, or urinal?: A Little Help from another person bathing (including washing, rinsing, drying)?: A Little Help from another person to put on and taking off regular upper body clothing?: None Help from another person to put on and taking off regular lower body clothing?: A Little 6 Click Score: 20   End of Session Equipment Utilized During Treatment: Gait belt Nurse Communication: Mobility status  Activity Tolerance: Patient tolerated treatment well Patient left: in chair;with call bell/phone within reach;with chair alarm set;with family/visitor present;with nursing/sitter in room  OT Visit Diagnosis: Unsteadiness on feet (R26.81);Other symptoms and signs involving cognitive function                Time: 7829-5621 (-10 min with MD in room) OT Time Calculation (min): 51 min Charges:  OT General Charges $OT Visit: 1 Visit OT Evaluation $OT Eval Moderate Complexity: 1 Mod OT Treatments $Self Care/Home Management : 23-37  mins  Lou Cal, OT Acute Rehabilitation Services Pager [phone] Office [phone]   Raymondo Band 06/18/2020, 1:16 PM

## 2020-06-18 NOTE — TOC CAGE-AID Note (Signed)
Transition of Care [facility]) - CAGE-AID Screening   Patient Details  Name: Timothy [name] MRN: [id number] Date of Birth: 1961-09-15  Transition of Care [facility]) CM/SW Contact:    Emeterio Reeve, [place] Phone Number: 06/18/2020, 2:24 PM   Clinical Narrative:  CSW met with pt at bedside. CSW introduced self and explained her role at the hospital.  Pt reports he has a glass of wine with dinner nightly. Pt reports he has no concerns with his current alcohol use. Pt denies substance use. Pt declines resources and educational resources.   CAGE-AID Screening:    Have You Ever Felt You Ought to Cut Down on Your Drinking or Drug Use?: No Have People Annoyed You By Critizing Your Drinking Or Drug Use?: No Have You Felt Bad Or Guilty About Your Drinking Or Drug Use?: No Have You Ever Had a Drink or Used Drugs First Thing In The Morning to Steady Your Nerves or to Get Rid of a Hangover?: No CAGE-AID Score: 0  Substance Abuse Education Offered: Yes  Substance abuse interventions: Patient Counseling   Emeterio Reeve, Latanya Presser, [place] Social Worker [phone]

## 2020-06-18 NOTE — Progress Notes (Signed)
Physical Therapy Treatment Patient Details Name: Timothy [name] MRN: [id number] DOB: 12-02-1960 Today's Date: 06/18/2020    History of Present Illness 59 yo admitted with seizure. Pt with onset of HA while traveling in [place] 9/2 involing Rt neck and head with continued pain and treated for trigeminal neuralgia. Pt with episodes of confusion on 9/5,6, and 7. PMhx: thyroid disease    PT Comments    Pt with improved stability during ambulation and transfer ability. Pt reporting he doesn't want to go to outpt PT because he wants to save money. Pt given balance program HEP, pt and wife with good return demo and understanding. Progression provided as well.  Pt with c/o L knee pain, especially extension/hyper extension, RN notified. Acute PT to continue to follow.    Follow Up Recommendations  Outpatient PT     Equipment Recommendations  None recommended by PT    Recommendations for Other Services       Precautions / Restrictions Precautions Precautions: Fall Restrictions Weight Bearing Restrictions: No    Mobility  Bed Mobility Overal bed mobility: Modified Independent Bed Mobility: Supine to Sit;Sit to Supine     Supine to sit: Modified independent (Device/Increase time) Sit to supine: Modified independent (Device/Increase time)   General bed mobility comments: HOB elevated, no use of bedrail or physical assist  Transfers Overall transfer level: Needs assistance Equipment used: None Transfers: Sit to/from Stand Sit to Stand: Supervision         General transfer comment: no difficulty, mildly quick/impulsive  Ambulation/Gait Ambulation/Gait assistance: Supervision Gait Distance (Feet): 400 Feet Assistive device: None Gait Pattern/deviations: Step-through pattern;Decreased stride length Gait velocity: wfl Gait velocity interpretation: 1.31 - 2.62 ft/sec, indicative of limited community ambulator General Gait Details: pt with shorter step length due to bilat knee  pain L worse than R, no episode of LOB   Stairs Stairs: Yes Stairs assistance: Min guard Stair Management: One rail Right;Alternating pattern;Step to pattern;Forwards Number of Stairs: 12 General stair comments: pt ascended reciprocally without difficulty, pt had to descend with step to pattern leading with R foot due to L knee pain   Wheelchair Mobility    Modified Rankin (Stroke Patients Only)       Balance Overall balance assessment: Mild deficits observed, not formally tested                                          Cognition Arousal/Alertness: Awake/alert Behavior During Therapy: Impulsive Overall Cognitive Status: Impaired/Different from baseline Area of Impairment: Safety/judgement                         Safety/Judgement: Decreased awareness of safety     General Comments: pt states "Im going to do my PT and mental exercises at home to save money. I'm not going to pay $75 a visit." pt aware he can't return to work for a couple of weeks however states he wants to get back to yoga      Exercises Other Exercises Other Exercises: given balance program HEP, pt returned demonstrated, wife present with verbal understanding    General Comments General comments (skin integrity, edema, etc.): VSS      Pertinent Vitals/Pain Pain Assessment: 0-10 Pain Score: 8  (L knee pain with extension, 2/10 at rest or flexion) Pain Location: L knee and L shld Pain Descriptors / Indicators: Sharp Pain Intervention(s):  Limited activity within patient's tolerance    Home Living                      Prior Function            PT Goals (current goals can now be found in the care plan section) Progress towards PT goals: Progressing toward goals    Frequency    Min 3X/week      PT Plan Current plan remains appropriate    Co-evaluation              AM-PAC PT "6 Clicks" Mobility   Outcome Measure  Help needed turning from your  back to your side while in a flat bed without using bedrails?: None Help needed moving from lying on your back to sitting on the side of a flat bed without using bedrails?: None Help needed moving to and from a bed to a chair (including a wheelchair)?: A Little Help needed standing up from a chair using your arms (e.g., wheelchair or bedside chair)?: A Little Help needed to walk in hospital room?: A Little Help needed climbing 3-5 steps with a railing? : A Little 6 Click Score: 20    End of Session Equipment Utilized During Treatment: Gait belt Activity Tolerance: Patient tolerated treatment well Patient left: in bed;with call bell/phone within reach;with family/visitor present Nurse Communication: Mobility status (possible imaging for bilat knees) PT Visit Diagnosis: Other abnormalities of gait and mobility (R26.89)     Time: 3295-1884 PT Time Calculation (min) (ACUTE ONLY): 27 min  Charges:  $Gait Training: 8-22 mins $Therapeutic Exercise: 8-22 mins                     Kittie Plater, PT, DPT Acute Rehabilitation Services Pager #: [phone] Office #: [phone]    Berline Lopes 06/18/2020, 2:36 PM

## 2020-06-19 LAB — PROTEIN C, TOTAL: Protein C, Total: 85 % (ref 60–150)

## 2020-06-20 LAB — ANTIPHOSPHOLIPID SYNDROME EVAL, BLD
Anticardiolipin IgA: <9 APL U/mL (ref 0–11)
Anticardiolipin IgG: <9 GPL U/mL (ref 0–14)
Anticardiolipin IgM: <9 MPL U/mL (ref 0–12)
DRVVT: 35.5 s (ref 0.0–47.0)
PTT Lupus Anticoagulant: 27.2 s (ref 0.0–51.9)
Phosphatydalserine, IgA: 1 APS IgA (ref 0–20)
Phosphatydalserine, IgG: <10 Units (ref 0–30)
Phosphatydalserine, IgM: <22 MPS IgM (ref <22)

## 2020-06-20 LAB — FACTOR 5 LEIDEN

## 2020-06-20 LAB — PROTHROMBIN GENE MUTATION

## 2020-06-24 NOTE — Progress Notes (Signed)
Kindly inform the patient that lab work for increased familial tendency for blood clots was mostly normal except for factor V Leiden and I will discuss what this means when I see him in person for follow-up visit in the office.  Nothing to worry about.  He needs to continue on his current blood thinner

## 2020-06-25 ENCOUNTER — Encounter: Payer: Self-pay | Admitting: *Deleted

## 2020-06-26 NOTE — Telephone Encounter (Signed)
I called and left a message for the patient on his answering machine.  I do not think Pradaxa is causing the rash which is only localized to the back.  If the rash is very rare and if it were to occur it would be all over the body.  Is far as changing to Eliquis is concerned there is really not much evidence to support that it is possible but would not recommend that unless I am convinced Pradaxa is not working.  As for his headaches advised him to take Tylenol or over-the-counter medications but to drink plenty of fluids and keep himself well-hydrated.  If the headaches out-of-control he was advised to call me or go to the emergency room.

## 2020-07-03 ENCOUNTER — Telehealth: Payer: Self-pay | Admitting: Neurology

## 2020-07-03 ENCOUNTER — Encounter: Payer: Self-pay | Admitting: Neurology

## 2020-07-03 NOTE — Telephone Encounter (Signed)
Kindly inform the patient that he can start working out and to increase his activity gradually as tolerated.  I would start at 50% of his prior workout regimen and gradually increase in weekly intervals.  He did have some memory and cognitive difficulties when I saw him in the hospital and I would like to evaluate that in person at upcoming office visit in 07/10/20 before allowing him to go back to work..  If there is an opening I can try to see him sooner

## 2020-07-03 NOTE — Telephone Encounter (Signed)
Pt called need a call from the nurse. Have a couple of questions.

## 2020-07-03 NOTE — Telephone Encounter (Signed)
Called patient to reply to his my chart messages. He had multiple questions re: stroke and resuming work and activities. He stated his headaches are gone, back feels good, he is working from home but it's not ideal, memory is good, and his shoulder is better from seizure he had. He wants to know if he can safely work out, get hs heart rate up. 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 He expressed concern that his my chart messages hadn't been addressed; I advised they had been sent to Dr Leonie Man who is in hospital this week. However I will route this to on call MD and Dr Leonie Man.  His FU is 07/10/20; I put him on wait list for sooner if there's a cancellation. He verbalized he would like to return to work and know if he can work out. Message sent to Dr Tilden Dome and Dr Jannifer Franklin. Patient is hoping for call in next 24 hours.

## 2020-07-04 ENCOUNTER — Encounter: Payer: Self-pay | Admitting: *Deleted

## 2020-07-04 NOTE — Telephone Encounter (Signed)
Called patient and left detailed VM, also sent my chart with Dr Clydene Fake recommendations.

## 2020-07-08 ENCOUNTER — Ambulatory Visit (INDEPENDENT_AMBULATORY_CARE_PROVIDER_SITE_OTHER): Payer: BLUE CROSS/BLUE SHIELD | Admitting: Neurology

## 2020-07-08 ENCOUNTER — Other Ambulatory Visit: Payer: Self-pay

## 2020-07-08 ENCOUNTER — Encounter: Payer: Self-pay | Admitting: Neurology

## 2020-07-08 VITALS — BP 116/79 | HR 62 | Ht 67.0 in | Wt 143.0 lb

## 2020-07-08 DIAGNOSIS — D6851 Activated protein C resistance: Secondary | ICD-10-CM

## 2020-07-08 DIAGNOSIS — G08 Intracranial and intraspinal phlebitis and thrombophlebitis: Secondary | ICD-10-CM

## 2020-07-08 NOTE — Progress Notes (Signed)
[facility] [address] [place]. [place] [address] [phone]       OFFICE FOLLOW-UP NOTE  [name] Date of Birth:  [birth year] Medical Record Number:  [id number]   HPI: Timothy [name] is a 59 year old Caucasian male seen today for initial office follow-up visit following hospital admission for cerebral venous sinus thrombosis 3 weeks ago.  History is obtained from him, review of electronic medical records and I have personally reviewed available pertinent imaging films in PACS.  He has no significant past medical history except mild hyperlipidemia which is diet controlled and hypothyroidism.  He presented on 06/16/2020 to [facility] for [facility].  10 days prior while in [place] on a business trip he developed new onset headaches on the right side starting at the base of the neck and radiating up to the top of his head.  This ranged from moderate to severe intensity and improved with a course of Advil and prednisone.  The headache however persisted and he was seen in [place] ER few days prior to discharge.  On 06/09/2020 he had an episode of confusion where he was trying to open the car door but instead was polishing the windshield and making movements that look like taking spider webs out of the car door.  He had another episode of confusion and few days later where he could not figure out how to set his home security alarm and in the next day he had trouble using his computer and texting.  Complete confusion resolved but he continued to have posterior headaches when the day prior to admission he had an episode of what looked like a seizure.  He started massaging his back and suddenly yelled out and had arms crossed in a tonic position partially up and became rigid and started having some slurring of his speech.  His episode lasted several minutes by the time EMS arrived he was found to be confused.  He had a somewhat similar episode upon arrival in the emergency  room at [facility] with lasted less than a minute and resolved.  CT scan of the head on admission showed left parietal hypodensity with scattered foci of hemorrhage suggestive of sagittal sinus thrombosis.  CTA angiogram of the head and neck showed no large vessel stenosis or occlusion but showed extensive dural sinus thrombosis involving superior sagittal, right-sided transverse and sigmoid sinuses with partial filling defect in the straight sinus.  MRI scan of the brain confirmed extensive dural sinus thrombosis involving right superior sagittal, right transverse and right sigmoid sinuses.  There was left parietal edema with petechial hemorrhages.  LDL cholesterol is 99 mg percent.  Hemoglobin A1c was 5.5.  Hypercoagulable panel labs were all negative except for factor V Leiden for which patient was found to have heterozygote state.  EEG showed no seizure activity but patient was loaded with Keppra and started on 750 twice daily for these 2 episodes of possible seizures.  He was advised not to drive.  Patient was started on Pradaxa for anticoagulation.  He states is done well since discharge.  Is able to work from home on the computer well and has no issues.  His headaches have now resolved and gone.  Is is walking 2 miles a day.  Is doing up to 50% of his workout capacity but does feel little tired and hungry after his workout.  Is tolerating Pradaxa with only minor bruising but no bleeding episodes.  He wants to return to work full-time.  He has no complaints today.  He denies any prior history of DVT pulmonary embolism.  He is adopted and does not know his family history.  ROS:   14 system review of systems is positive for bruising only all other systems negative  PMH:  Past Medical History:  Diagnosis Date  . High cholesterol   . Thyroid disease     Social History:  Social History   Socioeconomic History  . Marital status: Married    Spouse name: Timothy [name]  . Number of children: 2  . Years of  education: Not on file  . Highest education level: Some college, no degree  Occupational History  . Not on file  Tobacco Use  . Smoking status: Never Smoker  . Smokeless tobacco: Never Used  Vaping Use  . Vaping Use: Never used  Substance and Sexual Activity  . Alcohol use: Yes    Alcohol/week: 1.0 standard drink    Types: 1 Glasses of wine per week    Comment: nightly  . Drug use: Not Currently  . Sexual activity: Not on file  Other Topics Concern  . Not on file  Social History Narrative   Lives with wife and 2 dogs   Right handed   Drinks 3 cups of caffeine daily (iced tea)   Social Determinants of Health   Financial Resource Strain:   . Difficulty of Paying Living Expenses: Not on file  Food Insecurity:   . Worried About Charity fundraiser in the Last Year: Not on file  . Ran Out of Food in the Last Year: Not on file  Transportation Needs:   . Lack of Transportation (Medical): Not on file  . Lack of Transportation (Non-Medical): Not on file  Physical Activity:   . Days of Exercise per Week: Not on file  . Minutes of Exercise per Session: Not on file  Stress:   . Feeling of Stress : Not on file  Social Connections:   . Frequency of Communication with Friends and Family: Not on file  . Frequency of Social Gatherings with Friends and Family: Not on file  . Attends Religious Services: Not on file  . Active Member of Clubs or Organizations: Not on file  . Attends Archivist Meetings: Not on file  . Marital Status: Not on file  Intimate Partner Violence:   . Fear of Current or Ex-Partner: Not on file  . Emotionally Abused: Not on file  . Physically Abused: Not on file  . Sexually Abused: Not on file    Medications:   Current Outpatient Medications on File Prior to Visit  Medication Sig Dispense Refill  . acetaminophen (TYLENOL) 325 MG tablet Take 2 tablets (650 mg total) by mouth every 4 (four) hours as needed for mild pain or headache.    . dabigatran  (PRADAXA) 150 MG CAPS capsule Take 1 capsule (150 mg total) by mouth every 12 (twelve) hours. 60 capsule 2  . HYDROcodone-acetaminophen (NORCO/VICODIN) 5-325 MG tablet Take 0.5-1 tablets by mouth every 6 (six) hours as needed.    . levETIRAcetam (KEPPRA) 750 MG tablet Take 1 tablet (750 mg total) by mouth 2 (two) times daily. 60 tablet 2  . levothyroxine (SYNTHROID) 25 MCG tablet Take 25 mcg by mouth daily before breakfast.     No current facility-administered medications on file prior to visit.    Allergies:  No Known Allergies  Physical Exam General: well developed, well nourished middle-aged Caucasian male, seated, in no evident distress  Head: head normocephalic and atraumatic.  Neck: supple with no carotid or supraclavicular bruits Cardiovascular: regular rate and rhythm, no murmurs Musculoskeletal: no deformity Skin:  no rash/petichiae minor bruises both forearms. Vascular:  Normal pulses all extremities Vitals:   07/08/20 1508  BP: 116/79  Pulse: 62   Neurologic Exam Mental Status: Awake and fully alert. Oriented to place and time. Recent and remote memory intact. Attention span, concentration and fund of knowledge appropriate. Mood and affect appropriate.  Recall 3/3.  Able to name 8 animals which can walk on 4 legs. Cranial Nerves: Fundoscopic exam reveals sharp disc margins. Pupils equal, briskly reactive to light. Extraocular movements full without nystagmus. Visual fields full to confrontation. Hearing intact. Facial sensation intact. Face, tongue, palate moves normally and symmetrically.  Motor: Normal bulk and tone. Normal strength in all tested extremity muscles. Sensory.: intact to touch ,pinprick .position and vibratory sensation.  Coordination: Rapid alternating movements normal in all extremities. Finger-to-nose and heel-to-shin performed accurately bilaterally. Gait and Station: Arises from chair without difficulty. Stance is normal. Gait demonstrates normal stride  length and balance . Able to heel, toe and tandem walk without difficulty.  Reflexes: 1+ and symmetric. Toes downgoing.   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  He has heterozygous state of factor V Leiden but no prior history of venous clots.     PLAN: I had a long discussion with the patient regarding his cerebral venous sinus thrombosis and finding of factor V Leiden heterozygous state.  This is found in about 3 to 8% of the population and does carry a 4-8 fold increased risk of developing venous blood clots and may merit long-term anticoagulation.  I have advised him to keep we will send always well-hydrated and to avoid prolonged.'s of inactivity like long car rides or flights.  I recommend he continue Pradaxa and the current dosage will at least a year and perhaps lifelong if he can afford it.  I advised him to maintain adequate hydration and to avoid prolonged inactivity.  Like riding in a long car ride or airplane travel on long flights.  Continue Keppra and the current dose of 750 mg twice daily for seizure prophylaxis.  Check follow-up MRI scan of the brain and MR venogram in 6 weeks to look for resolution of his venous thrombosis.  He can return to work without restrictions.  I have advised him not to drive for 6 months as per [facility].  I also recommend continuing anticonvulsions for at least 2 to 3 years.  Refer to hematology to comment on duration of long-term anticoagulation given factor V heterozygote state.  Greater than 50% time during this 30-minute visit was spent on counseling and coordination of care about his cerebral venous sinus thrombosis and factor V Leiden heterozygote state.  He will return for follow-up in the future in 3 months or call earlier if necessary. Greater than 50% of time during this 35 minute visit was spent on counseling,explanation of diagnosis,  planning of further management, discussion with patient and family and coordination of care Antony Contras, MD Note: This document was prepared with digital dictation and possible smart phrase technology. Any transcriptional errors that result from this process are unintentional

## 2020-07-08 NOTE — Patient Instructions (Addendum)
I had a long discussion with the patient regarding his cerebral venous sinus thrombosis and finding of factor V Leiden heterozygous state.  This is found in about 3 to 8% of the population and does carry a 4-8 fold increased risk of developing venous blood clots and may merit long-term anticoagulation.  I recommend he continue Pradaxa and the current dosage will at least a year and perhaps lifelong if he can afford it.  I advised him to maintain adequate hydration and to avoid prolonged inactivity.  Like riding in a long car ride or airplane travel on long flights.  Continue Keppra and the current dose of 750 mg twice daily for seizure prophylaxis.  Check follow-up MRI scan of the brain and MR venogram in 6 weeks to look for resolution of his venous thrombosis.  He can return to work without restrictions.  He will return for follow-up in the future in 3 months or call earlier if necessary.

## 2020-07-08 NOTE — Telephone Encounter (Signed)
I called the patient and he had an appointment to see me later today and we will defer his questions till his appointment with me today

## 2020-07-09 ENCOUNTER — Telehealth: Payer: Self-pay | Admitting: Neurology

## 2020-07-09 NOTE — Telephone Encounter (Signed)
no to the covid questions MR Brain w/wo contrast & MRV Head wo contrast Dr. Valentina Gu Auth: [id number] (exp. 07/09/20 to 01/04/21). Patient is scheduled at [facility] for 08/21/20.

## 2020-07-10 ENCOUNTER — Ambulatory Visit: Payer: BLUE CROSS/BLUE SHIELD | Admitting: Neurology

## 2020-07-17 ENCOUNTER — Other Ambulatory Visit: Payer: Self-pay | Admitting: Emergency Medicine

## 2020-07-17 ENCOUNTER — Telehealth: Payer: Self-pay | Admitting: Neurology

## 2020-07-17 MED ORDER — DABIGATRAN ETEXILATE MESYLATE 150 MG PO CAPS: 150.0000 mg | ORAL_CAPSULE | Freq: Two times a day (BID) | ORAL | 0 refills | Status: DC

## 2020-07-17 MED ORDER — LEVETIRACETAM 750 MG PO TABS
750.0000 mg | ORAL_TABLET | Freq: Two times a day (BID) | ORAL | 0 refills | Status: DC
Start: 2020-07-17 — End: 2020-10-09

## 2020-07-17 NOTE — Telephone Encounter (Signed)
Pt's wife Santiago Glad, not on DPR called stating that a MyChart message was sent about the pt's dabigatran (PRADAXA) 150 MG CAPS capsule and the levETIRAcetam (KEPPRA) 750 MG tablet and she is wanting to know if it has been looked in to. Please advise.

## 2020-07-17 NOTE — Telephone Encounter (Signed)
Note: the request was handled by our office this afternoon.

## 2020-08-07 ENCOUNTER — Encounter: Payer: Self-pay | Admitting: Hematology & Oncology

## 2020-08-07 ENCOUNTER — Other Ambulatory Visit: Payer: Self-pay

## 2020-08-07 ENCOUNTER — Ambulatory Visit: Payer: BLUE CROSS/BLUE SHIELD | Admitting: Neurology

## 2020-08-07 ENCOUNTER — Inpatient Hospital Stay: Payer: BLUE CROSS/BLUE SHIELD

## 2020-08-07 ENCOUNTER — Inpatient Hospital Stay: Payer: BLUE CROSS/BLUE SHIELD | Attending: Hematology & Oncology | Admitting: Hematology & Oncology

## 2020-08-07 VITALS — BP 114/76 | HR 66 | Temp 98.8°F | Resp 20 | Ht 67.0 in | Wt 150.0 lb

## 2020-08-07 DIAGNOSIS — G08 Intracranial and intraspinal phlebitis and thrombophlebitis: Secondary | ICD-10-CM | POA: Diagnosis not present

## 2020-08-07 DIAGNOSIS — D6851 Activated protein C resistance: Secondary | ICD-10-CM | POA: Diagnosis not present

## 2020-08-07 DIAGNOSIS — Z7901 Long term (current) use of anticoagulants: Secondary | ICD-10-CM | POA: Diagnosis not present

## 2020-08-07 DIAGNOSIS — D6861 Antiphospholipid syndrome: Secondary | ICD-10-CM | POA: Diagnosis not present

## 2020-08-07 LAB — CBC WITH DIFFERENTIAL (CANCER CENTER ONLY)
Abs Immature Granulocytes: 0.01 10*3/uL (ref 0.00–0.07)
Basophils Absolute: 0 10*3/uL (ref 0.0–0.1)
Basophils Relative: 1 %
Eosinophils Absolute: 0.1 10*3/uL (ref 0.0–0.5)
Eosinophils Relative: 3 %
HCT: 47.7 % (ref 39.0–52.0)
Hemoglobin: 15.4 g/dL (ref 13.0–17.0)
Immature Granulocytes: 0 %
Lymphocytes Relative: 25 %
Lymphs Abs: 1.2 10*3/uL (ref 0.7–4.0)
MCH: 27 pg (ref 26.0–34.0)
MCHC: 32.3 g/dL (ref 30.0–36.0)
MCV: 83.5 fL (ref 80.0–100.0)
Monocytes Absolute: 0.5 10*3/uL (ref 0.1–1.0)
Monocytes Relative: 11 %
Neutro Abs: 3 10*3/uL (ref 1.7–7.7)
Neutrophils Relative %: 60 %
Platelet Count: 202 10*3/uL (ref 150–400)
RBC: 5.71 MIL/uL (ref 4.22–5.81)
RDW: 13.4 % (ref 11.5–15.5)
WBC Count: 4.9 10*3/uL (ref 4.0–10.5)
nRBC: 0 % (ref 0.0–0.2)

## 2020-08-07 LAB — CMP (CANCER CENTER ONLY)
ALT: 34 U/L (ref 0–44)
AST: 27 U/L (ref 15–41)
Albumin: 4.5 g/dL (ref 3.5–5.0)
Alkaline Phosphatase: 88 U/L (ref 38–126)
Anion gap: 6 (ref 5–15)
BUN: 16 mg/dL (ref 6–20)
CO2: 31 mmol/L (ref 22–32)
Calcium: 9.9 mg/dL (ref 8.9–10.3)
Chloride: 104 mmol/L (ref 98–111)
Creatinine: 1 mg/dL (ref 0.61–1.24)
GFR, Estimated: >60 mL/min (ref >60)
Glucose, Bld: 97 mg/dL (ref 70–99)
Potassium: 4.9 mmol/L (ref 3.5–5.1)
Sodium: 141 mmol/L (ref 135–145)
Total Bilirubin: 0.6 mg/dL (ref 0.3–1.2)
Total Protein: 6.6 g/dL (ref 6.5–8.1)

## 2020-08-07 LAB — LACTATE DEHYDROGENASE: LDH: 144 U/L (ref 98–192)

## 2020-08-07 NOTE — Progress Notes (Signed)
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 New Patient (Initial Visit)    CVA x 2.  : I am have a blood clot problem and developed a blood clot on the brain.  HPI: Timothy [name] is a very interesting 59 year old white male.  Of note, he was in the [employer].  He was at [employer].  He served in [employer] and as such he is a true Optometrist hero.  He is adopted.  As such, he does not know his family history.  He has been in great health.  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  This is incredibly fascinating.  He was out in [place] on a business trip back in I think late August early September.  He started to have some headaches.  This was on the right side of the head at the base of the neck.  The headaches persisted.  He subsequently came back from his business trip.  The headaches continued.  He then had some confusion.  He had trouble with his computer.  He cannot figure out the home security system.  He then had what appeared to be a possible seizure.  His wife called EMS.  He was subsequently sent to [facility].  A CT scan of the head showed a left parietal hypodensity suggestive of sagittal sinus thrombosis.  CT angiogram of the head and neck showed no large vessel stenosis or occlusion but extensive dural sinus thrombus involving the superior sagittal, right-sided transverse and sigmoid sinuses.  He had a subsequent MRI which confirmed the dural sinus thrombosis on the right side.  He had a an extensive work-up.  Surprisingly, he was found to be heterozygous for factor V Leiden.  He was placed on Pradaxa.  He has been followed, incredibly thoroughly by Dr. Leonie Man of neurology.  He was kindly referred to the [place] [facility] for further evaluation.  He feels good now.  He is on Pradaxa.  He is exercising.  He is working.  He is not having  any problems with bleeding.  There is no nausea or vomiting.  He has had no blurred vision.  He has had no problems with speech.  He swallowing okay.  He does not do any supplements.  He does not do testosterone.  He does not smoke.  He does not have diabetes.  He does not have any cholesterol issues.  Again, he does not know his family history because he is adopted.  It is incredibly interesting to talk to him about his work.  Overall, I would say his performance status is ECOG 0.    Past Medical History:  Diagnosis Date  . High cholesterol   . Thyroid disease   :  Past Surgical History:  Procedure Laterality Date  . EXCISIONAL HEMORRHOIDECTOMY    . INGUINAL HERNIA REPAIR Bilateral   . SHOULDER ARTHROSCOPY WITH OPEN ROTATOR CUFF REPAIR Right   :   Current Outpatient Medications:  .  acetaminophen (TYLENOL) 325 MG tablet, Take 2 tablets (650 mg total) by mouth every 4 (four) hours as needed for mild pain or headache., Disp: , Rfl:  .  dabigatran (PRADAXA) 150 MG CAPS capsule, Take 1 capsule (150 mg total) by mouth every 12 (twelve) hours., Disp: 180 capsule, Rfl: 0 .  levETIRAcetam (KEPPRA) 750 MG tablet, Take 1 tablet (750 mg total) by mouth  2 (two) times daily., Disp: 180 tablet, Rfl: 0 .  levothyroxine (SYNTHROID) 25 MCG tablet, Take 25 mcg by mouth daily before breakfast., Disp: , Rfl:  .  HYDROcodone-acetaminophen (NORCO/VICODIN) 5-325 MG tablet, Take 0.5-1 tablets by mouth every 6 (six) hours as needed., Disp: , Rfl: :  :  No Known Allergies:  Family History  Adopted: Yes  :  Social History   Socioeconomic History  . Marital status: Married    Spouse name: Timothy [name]  . Number of children: 2  . Years of education: Not on file  . Highest education level: Some college, no degree  Occupational History  . Not on file  Tobacco Use  . Smoking status: Never Smoker  . Smokeless tobacco: Never Used  Vaping Use  . Vaping Use: Never used  Substance and Sexual Activity   . Alcohol use: Yes    Alcohol/week: 1.0 standard drink    Types: 1 Glasses of wine per week    Comment: nightly  . Drug use: Not Currently  . Sexual activity: Yes  Other Topics Concern  . Not on file  Social History Narrative   Lives with wife and 2 dogs   Right handed   Drinks 3 cups of caffeine daily (iced tea)   Social Determinants of Health   Financial Resource Strain:   . Difficulty of Paying Living Expenses: Not on file  Food Insecurity:   . Worried About Charity fundraiser in the Last Year: Not on file  . Ran Out of Food in the Last Year: Not on file  Transportation Needs:   . Lack of Transportation (Medical): Not on file  . Lack of Transportation (Non-Medical): Not on file  Physical Activity:   . Days of Exercise per Week: Not on file  . Minutes of Exercise per Session: Not on file  Stress:   . Feeling of Stress : Not on file  Social Connections:   . Frequency of Communication with Friends and Family: Not on file  . Frequency of Social Gatherings with Friends and Family: Not on file  . Attends Religious Services: Not on file  . Active Member of Clubs or Organizations: Not on file  . Attends Archivist Meetings: Not on file  . Marital Status: Not on file  Intimate Partner Violence:   . Fear of Current or Ex-Partner: Not on file  . Emotionally Abused: Not on file  . Physically Abused: Not on file  . Sexually Abused: Not on file  :  Review of Systems  Constitutional: Negative.   HENT: Negative.   Eyes: Negative.   Respiratory: Negative.   Cardiovascular: Negative.   Gastrointestinal: Negative.   Genitourinary: Negative.   Musculoskeletal: Negative.   Skin: Negative.   Neurological: Positive for sensory change and headaches.  Endo/Heme/Allergies: Negative.   Psychiatric/Behavioral: Negative.      Exam:  This is a well-developed and well-nourished white male in no obvious distress.  Vital signs show a temperature of 98.8.  Pulse 66.  Blood  pressure 114/76.  Weight is 150 pounds.  Head and neck exam shows no ocular or oral lesions.  He has no scleral icterus.  His pupils react appropriately.  There is no nystagmus.  He has no oral lesions.  He has no adenopathy in the neck.  Lungs are clear bilaterally.  Cardiac exam regular rate and rhythm with no murmurs, rubs or bruits.  Abdomen is soft.  Has good bowel sounds.  There is no fluid wave.  There is no palpable liver or spleen tip.  Back exam shows no tenderness over the spine, ribs or hips.  Extremities shows no clubbing, cyanosis or edema.  Neurological exam shows no focal neurological deficits.  Skin exam shows no rashes, ecchymoses or petechia.   @IPVITALS @   Recent Labs    08/07/20 1025  WBC 4.9  HGB 15.4  HCT 47.7  PLT 202   Recent Labs    08/07/20 1025  NA 141  K 4.9  CL 104  CO2 31  GLUCOSE 97  BUN 16  CREATININE 1.00  CALCIUM 9.9    Blood smear review: None  Pathology: None    Assessment and Plan: Mr. Hollar is a very nice 59 year old white male.  He is heterozygous for factor V Leiden mutation.  Secondary to this, he developed a sinus thrombosis.  This is incredibly unusual.  However, this definitely is well reported with factor V Leiden mutation.  Neurologically he seems to have recovered very nicely.  He has an excellent performance status.  Is working out.  He is working.  I think the issue is how long to keep him on anticoagulation.,  Sure he requires lifelong anticoagulation.  However, I would certainly put him on full dose anticoagulation for a year followed by maintenance for at least a year.  He has no other risk factors for thromboembolic disease.  He exercises.  He is doing all that he can do to try to minimize his risk.  He says he is going to have a MRI later on this month.  Apparently, his insurance changes at the end of November and then he needs to pay his deductible of $7000.  We will keep him on Pradaxa.  Apparently he will have  Pradaxa paid for for the next 6 months.  His renal function is okay so I will see any problems with him being on Pradaxa.  I would like to see him back in about 3 months.  I think this is reasonable.  He is quite intelligent.  I spent a good hour with him.  He is incredibly interesting.  He served our country.  I thanked him for his service.  We will get him back sooner if receiving any problems or if he has any issues.

## 2020-08-21 ENCOUNTER — Other Ambulatory Visit: Payer: BLUE CROSS/BLUE SHIELD

## 2020-08-22 NOTE — Telephone Encounter (Signed)
lvm for pt to call back to r/s

## 2020-09-03 ENCOUNTER — Telehealth: Payer: Self-pay | Admitting: Neurology

## 2020-09-03 ENCOUNTER — Ambulatory Visit (INDEPENDENT_AMBULATORY_CARE_PROVIDER_SITE_OTHER): Payer: BLUE CROSS/BLUE SHIELD

## 2020-09-03 ENCOUNTER — Other Ambulatory Visit: Payer: Self-pay

## 2020-09-03 DIAGNOSIS — G08 Intracranial and intraspinal phlebitis and thrombophlebitis: Secondary | ICD-10-CM | POA: Diagnosis not present

## 2020-09-03 MED ORDER — GADOBENATE DIMEGLUMINE 529 MG/ML IV SOLN
15.0000 mL | Freq: Once | INTRAVENOUS | Status: AC | PRN
  Administered 2020-09-03: 15 mL via INTRAVENOUS

## 2020-09-03 NOTE — Telephone Encounter (Signed)
Discussed with Dr. Leonie Man and called patient to check back in another week for results.  If there isn't anything Dr. Leonie Man is concerned with then he is going to cancel his January appointment.  Patient expressed appreciation

## 2020-09-03 NOTE — Telephone Encounter (Signed)
Pt would like to know if he can receive his MRI results over the phone or in [place] rather than coming back for follow-up in January. Pt states he is trying to have this billed before his insurance renews as he has met his out of pocket costs for the year. Best call back # is [phone].

## 2020-09-08 NOTE — Progress Notes (Signed)
Kindly inform the patient that MR venogram study of the brain shows slight improvement in the previously blocked venous sinuses on the surface of the brain.  No new or worrisome finding

## 2020-09-08 NOTE — Progress Notes (Signed)
Kindly inform the patient that MRI scan of the brain shows expected improvement with resolution of the brain swelling and partial restoration of flow in the previously blocked cerebral venous sinuses and the brain.  No new or unexpected finding

## 2020-09-09 ENCOUNTER — Encounter: Payer: Self-pay | Admitting: *Deleted

## 2020-09-16 NOTE — Telephone Encounter (Signed)
Called patient and he said his wife is just worried.  He stated he is back to working out and if his heartrate goes up then he will have a very "light" headache and will go away quickly once his HR goes back down.  He is asking if this is normal?

## 2020-09-26 ENCOUNTER — Other Ambulatory Visit: Payer: Self-pay | Admitting: Neurology

## 2020-09-26 NOTE — Telephone Encounter (Signed)
Spoke with pharmacist. They have a 90 day supply refill they can fill now.

## 2020-10-09 ENCOUNTER — Other Ambulatory Visit: Payer: Self-pay | Admitting: Neurology

## 2020-10-15 ENCOUNTER — Telehealth: Payer: Self-pay

## 2020-10-15 ENCOUNTER — Ambulatory Visit: Payer: BLUE CROSS/BLUE SHIELD | Admitting: Neurology

## 2020-10-15 NOTE — Telephone Encounter (Signed)
pts wife called in and left a vm to cancel his 11/07/20 appts as he wishes to come in after ghis neuro appt and will c/b to r/s     aom

## 2020-11-07 ENCOUNTER — Other Ambulatory Visit: Payer: BLUE CROSS/BLUE SHIELD

## 2020-11-07 ENCOUNTER — Ambulatory Visit: Payer: BLUE CROSS/BLUE SHIELD | Admitting: Hematology & Oncology

## 2021-01-23 ENCOUNTER — Other Ambulatory Visit: Payer: Self-pay | Admitting: Neurology

## 2021-03-31 ENCOUNTER — Ambulatory Visit: Payer: BLUE CROSS/BLUE SHIELD | Admitting: Neurology

## 2021-04-21 ENCOUNTER — Other Ambulatory Visit: Payer: Self-pay | Admitting: Neurology

## 2021-07-02 ENCOUNTER — Telehealth: Payer: Self-pay | Admitting: Emergency Medicine

## 2021-07-02 NOTE — Telephone Encounter (Signed)
I spoke to the patient. He is agreeable to come in for a re-evaluation first. He will discuss his questions about having repeat scans with Dr. Leonie Man.

## 2021-07-02 NOTE — Telephone Encounter (Signed)
If patient calls back, no longer need to change/move his time.

## 2021-07-02 NOTE — Telephone Encounter (Signed)
Spoke to patient's wife and asked for return call when patient returns home (he is currently at a MD appointment).  Asking if patient would be willing to change appointment time from 10/4 @ 1430 with Dr. Leonie Man to 1245 with Janett Billow.  Same day, earlier time and seeing NP instead of MD.    She stated she would have him return my call.

## 2021-07-03 ENCOUNTER — Other Ambulatory Visit: Payer: Self-pay | Admitting: Neurology

## 2021-07-03 ENCOUNTER — Other Ambulatory Visit: Payer: Self-pay | Admitting: Emergency Medicine

## 2021-07-08 ENCOUNTER — Ambulatory Visit (INDEPENDENT_AMBULATORY_CARE_PROVIDER_SITE_OTHER): Payer: BLUE CROSS/BLUE SHIELD | Admitting: Neurology

## 2021-07-08 ENCOUNTER — Other Ambulatory Visit: Payer: Self-pay

## 2021-07-08 ENCOUNTER — Encounter: Payer: Self-pay | Admitting: Neurology

## 2021-07-08 VITALS — BP 133/80 | HR 66 | Ht 67.0 in | Wt 143.4 lb

## 2021-07-08 DIAGNOSIS — D6851 Activated protein C resistance: Secondary | ICD-10-CM

## 2021-07-08 DIAGNOSIS — G08 Intracranial and intraspinal phlebitis and thrombophlebitis: Secondary | ICD-10-CM | POA: Diagnosis not present

## 2021-07-08 NOTE — Progress Notes (Signed)
[facility] [address] [place]. [place] [address] [phone]       OFFICE FOLLOW-UP NOTE  [name] Date of Birth:  08/14/1961 Medical Record Number:  [id number]   HPI: Initial visit 07/08/2020 :Timothy [name] is a 60 year old Caucasian male seen today for initial office follow-up visit following hospital admission for cerebral venous sinus thrombosis 3 weeks ago.  History is obtained from him, review of electronic medical records and I have personally reviewed available pertinent imaging films in PACS.  He has no significant past medical history except mild hyperlipidemia which is diet controlled and hypothyroidism.  He presented on 06/16/2020 to [facility] for admission.  10 days prior while in [place] on a business trip he developed new onset headaches on the right side starting at the base of the neck and radiating up to the top of his head.  This ranged from moderate to severe intensity and improved with a course of Advil and prednisone.  The headache however persisted and he was seen in [place] [facility] few days prior to discharge.  On 06/09/2020 he had an episode of confusion where he was trying to open the car door but instead was polishing the windshield and making movements that look like taking spider webs out of the car door.  He had another episode of confusion and few days later where he could not figure out how to set his home security alarm and in the next day he had trouble using his computer and texting.  Complete confusion resolved but he continued to have posterior headaches when the day prior to admission he had an episode of what looked like a seizure.  He started massaging his back and suddenly yelled out and had arms crossed in a tonic position partially up and became rigid and started having some slurring of his speech.  His episode lasted several minutes by the time EMS arrived he was found to be confused.  He had a somewhat similar episode upon  arrival in the emergency room at [facility] with lasted less than a minute and resolved.  CT scan of the head on admission showed left parietal hypodensity with scattered foci of hemorrhage suggestive of sagittal sinus thrombosis.  CTA angiogram of the head and neck showed no large vessel stenosis or occlusion but showed extensive dural sinus thrombosis involving superior sagittal, right-sided transverse and sigmoid sinuses with partial filling defect in the straight sinus.  MRI scan of the brain confirmed extensive dural sinus thrombosis involving right superior sagittal, right transverse and right sigmoid sinuses.  There was left parietal edema with petechial hemorrhages.  LDL cholesterol is 99 mg percent.  Hemoglobin A1c was 5.5.  Hypercoagulable panel labs were all negative except for factor V Leiden for which patient was found to have heterozygote state.  EEG showed no seizure activity but patient was loaded with Keppra and started on 750 twice daily for these 2 episodes of possible seizures.  He was advised not to drive.  Patient was started on Pradaxa for anticoagulation.  He states is done well since discharge.  Is able to work from home on the computer well and has no issues.  His headaches have now resolved and gone.  Is is walking 2 miles a day.  Is doing up to 50% of his workout capacity but does feel little tired and hungry after his workout.  Is tolerating Pradaxa with only minor bruising but no bleeding episodes.  He wants to return to  work full-time.  He has no complaints today.  He denies any prior history of DVT pulmonary embolism.  He is adopted and does not know his family history. Update 07/08/2021: He returns for follow-up after last visit a year ago.  Is doing well and has had no recurrent stroke or TIA symptoms or seizures.  He is tolerating Pradaxa well without any bleeding or bruising.  He has discontinued Keppra now for nearly a year and has not had any breakthrough seizures.  He did  undergo follow-up MRI scan of the brain on 09/05/2020 which showed resolution of the left parietal edema as well as a clot in the superior sagittal sinus.  MR venogram showed diminished flow in the right tonsil and sigmoid sinus but increased flow in the superior sagittal sinus compared with the previous MRV  from September 2021.  He is heterozygote for factor V Leiden but has not had any other episodes of unprovoked deep vein thrombosis or pulmonary embolism or clots elsewhere. ROS:   14 system review of systems is positive for bruising only all other systems negative  PMH:  Past Medical History:  Diagnosis Date   Cancer ([place])    High cholesterol    Thyroid disease     Social History:  Social History   Socioeconomic History   Marital status: Married    Spouse name: Santiago Glad   Number of children: 2   Years of education: Not on file   Highest education level: Some college, no degree  Occupational History   Not on file  Tobacco Use   Smoking status: Never   Smokeless tobacco: Never  Vaping Use   Vaping Use: Never used  Substance and Sexual Activity   Alcohol use: Yes    Alcohol/week: 1.0 standard drink    Types: 1 Glasses of wine per week    Comment: nightly   Drug use: Not Currently   Sexual activity: Yes  Other Topics Concern   Not on file  Social History Narrative   Lives with wife and 2 dogs   Right handed   Drinks 3 cups of caffeine daily (iced tea)   Social Determinants of Health   Financial Resource Strain: Not on file  Food Insecurity: Not on file  Transportation Needs: Not on file  Physical Activity: Not on file  Stress: Not on file  Social Connections: Not on file  Intimate Partner Violence: Not on file    Medications:   Current Outpatient Medications on File Prior to Visit  Medication Sig Dispense Refill   acetaminophen (TYLENOL) 325 MG tablet Take 2 tablets (650 mg total) by mouth every 4 (four) hours as needed for mild pain or headache.     dabigatran  (PRADAXA) 150 MG CAPS capsule TAKE 1 CAPSULE BY MOUTH EVERY 12 HOURS 180 capsule 3   levothyroxine (SYNTHROID) 25 MCG tablet Take 25 mcg by mouth daily before breakfast.     Niacinamide-Zn-Cu-Methfo-Se-Cr (NICOTINAMIDE PO) Take 500 mg by mouth in the morning and at bedtime.     No current facility-administered medications on file prior to visit.    Allergies:  No Known Allergies  Physical Exam General: well developed, well nourished middle-aged Caucasian male, seated, in no evident distress Head: head normocephalic and atraumatic.  Neck: supple with no carotid or supraclavicular bruits Cardiovascular: regular rate and rhythm, no murmurs Musculoskeletal: no deformity Skin:  no rash/petichiae minor bruises both forearms. Vascular:  Normal pulses all extremities Vitals:   07/08/21 1434  BP: 133/80  Pulse: 66   Neurologic Exam Mental Status: Awake and fully alert. Oriented to place and time. Recent and remote memory intact. Attention span, concentration and fund of knowledge appropriate. Mood and affect appropriate.  Recall 3/3.  Able to name 8 animals which can walk on 4 legs. Cranial Nerves: Fundoscopic exam not done. Pupils equal, briskly reactive to light. Extraocular movements full without nystagmus. Visual fields full to confrontation. Hearing intact. Facial sensation intact. Face, tongue, palate moves normally and symmetrically.  Motor: Normal bulk and tone. Normal strength in all tested extremity muscles. Sensory.: intact to touch ,pinprick .position and vibratory sensation.  Coordination: Rapid alternating movements normal in all extremities. Finger-to-nose and heel-to-shin performed accurately bilaterally. Gait and Station: Arises from chair without difficulty. Stance is normal. Gait demonstrates normal stride length and balance . Able to heel, toe and tandem walk without difficulty.  Reflexes: 1+ and symmetric. Toes downgoing.       ASSESSMENT: 61 year old Caucasian male with  unprovoked cerebral venous sinus thrombosis in September 2021.  He has heterozygous state of factor V Leiden but no prior history of venous clots.     PLAN: had a long discussion with the patient regarding his cerebral venous sinus thrombosis and finding of factor V Leiden heterozygous state.  This is found in about 3 to 8% of the population and does carry a 4-8 fold increased risk of developing venous blood clots and may merit long-term anticoagulation.  I have advised him to keep himself  well-hydrated and to avoid prolonged.periods of inactivity like long car rides or flights. .  Since it has been more than a year of anticoagulation with Pradaxa it may be time to consider whether he can come off it and stay on aspirin alone.  I offered him a referral to a hematologist to discuss this further for a second opinion but patient wants to do his own research and decide.  In case he wants to stop the Pradaxa I would recommend aspirin 81 mg daily for life.  If he has a second episode of venous clot he will need lifelong anticoagulation.Marland KitchenHe will return for follow-up in the future in 6 months or call earlier if necessary. Greater than 50% of time during this 35 minute visit was spent on counseling,explanation of diagnosis of cerebral venous sinus thrombosis and heterozygote state for factor V Leiden mutation, planning of further management, discussion with patient and family and coordination of care Antony Contras, MD Note: This document was prepared with digital dictation and possible smart phrase technology. Any transcriptional errors that result from this process are unintentional

## 2021-07-08 NOTE — Patient Instructions (Signed)
I had a long discussion with the patient regarding his cerebral venous sinus thrombosis and finding of factor V Leiden heterozygous state.  This is found in about 3 to 8% of the population and does carry a 4-8 fold increased risk of developing venous blood clots and may merit long-term anticoagulation.  I have advised him to keep we will send always well-hydrated and to avoid prolonged.'s of inactivity like long car rides or flights. .  Since it has been more than a year of anticoagulation with Pradaxa it may be time to consider whether he can come off it and stay on aspirin alone.  I offered him a referral to a hematologist to discuss this for a second opinion but patient wants to do his own research and decide.  In case he wants to stop the Pradaxa I would recommend aspirin 81 mg daily for life.  If he has a second episode of venous clot he will need lifelong anticoagulation.Marland KitchenHe will return for follow-up in the future in 6 months or call earlier if necessary.

## 2021-07-29 ENCOUNTER — Encounter: Payer: Self-pay | Admitting: Hematology & Oncology

## 2021-08-01 IMAGING — CT CT ANGIO HEAD
1 of 12 series · 5 of 33 positions shown · IV contrast (omnipaque)
Comparison: None available.

CLINICAL DATA: Initial evaluation for acute seizure like activity,
possible stroke.

EXAM:
CT ANGIOGRAPHY HEAD AND NECK
TECHNIQUE: Multidetector CT imaging of the head and neck was performed using
the standard protocol during bolus administration of intravenous
contrast. Multiplanar CT image reconstructions and MIPs were
obtained to evaluate the vascular anatomy. Carotid stenosis
measurements (when applicable) are obtained utilizing NASCET
criteria, using the distal internal carotid diameter as the
denominator.
CONTRAST:  75mL OMNIPAQUE IOHEXOL 350 MG/ML SOLN

[Series 11: cta neck axial · axial · 0.39mm/px · z∈[-328,-82]mm · 5 of 370 slices shown]
[im 62/370  soft-tissue]
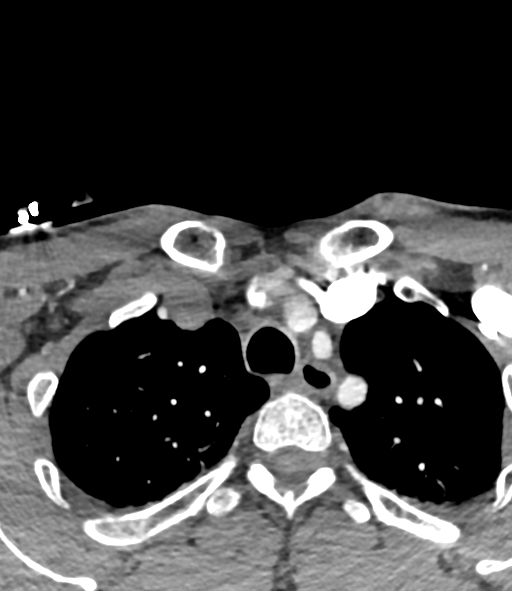
[im 124/370  bone]
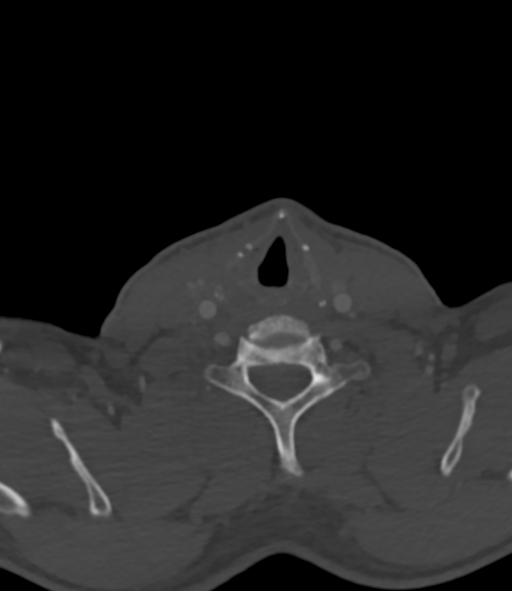
[im 185/370  soft-tissue]
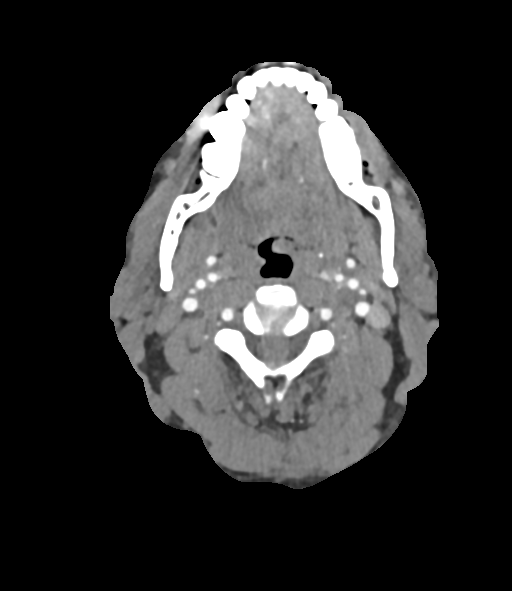
[im 247/370  bone]
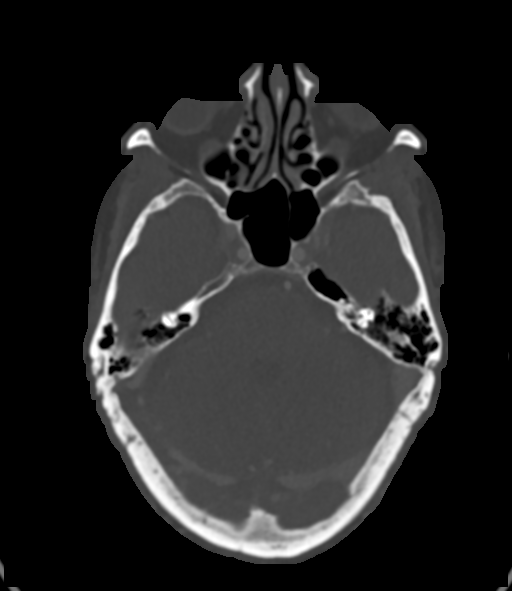
[im 308/370  soft-tissue]
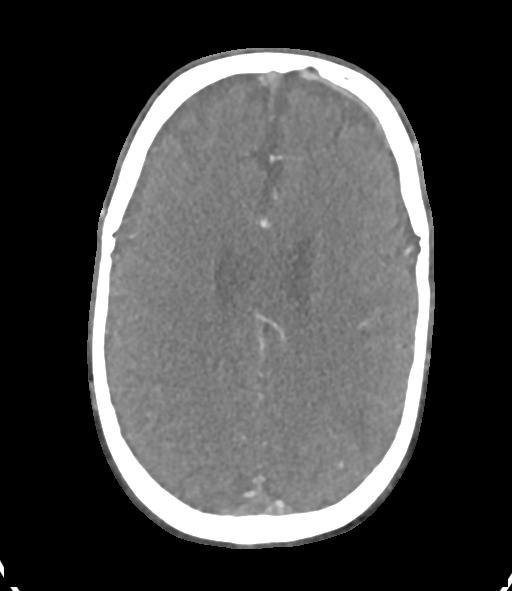

[5 of 33 positions shown; findings below may reference images not displayed]

FINDINGS: CT HEAD FINDINGS

Brain: Age-related cerebral atrophy. Focal wedge-shaped hypodensity
seen involving the posterior left parietal lobe (series 5, image
24), concerning for evolving infarct. Few small foci of associated
hemorrhage seen at the deep aspect of this ischemic change, largest
of which measures 6 mm. No associated mass effect.

No other evidence for acute intracranial hemorrhage or large vessel
territory infarct. No mass lesion or midline shift. No hydrocephalus
or extra-axial fluid collection.

Vascular: No hyperdense vessel.

Skull: Scalp soft tissues and calvarium within normal limits.

Sinuses: Mild scattered mucosal thickening noted within the
ethmoidal air cells. Paranasal sinuses are otherwise clear. No
mastoid effusion.

Orbits: Globes and orbital soft tissues demonstrate no acute
finding.

Review of the MIP images confirms the above findings

CTA NECK FINDINGS

Aortic arch: Visualized aortic arch normal caliber with normal 3
vessel morphology. No hemodynamically significant stenosis or other
abnormality seen about the origin of the great vessels.

Right carotid system: Right common and internal carotid arteries
widely patent without stenosis, dissection or occlusion.

Left carotid system: Left common and internal carotid arteries
widely patent without stenosis, dissection or occlusion.

Vertebral arteries: Both vertebral arteries arise from the
subclavian arteries. Atheromatous plaque at the origins of both
vertebral arteries with no more than mild stenosis. Vertebral
arteries otherwise widely patent without stenosis, dissection or
occlusion.

Skeleton: No suspicious osseous lesions. Mild multilevel cervical
spondylosis, most pronounced at C5-6 and C6-7. Minimal wedging at
the superior endplates of T3 and T5 noted, favored to be chronic.
Calcification at the longus coli tendon without associated
inflammation to suggest acute tendinitis.

Other neck: No other acute soft tissue abnormality within the neck.
No mass lesion or adenopathy.

Upper chest: Mild atelectatic changes noted within the visualized
lungs. Visualized upper chest demonstrates no other acute finding.

Review of the MIP images confirms the above findings

CTA HEAD FINDINGS

Anterior circulation: Petrous segments widely patent bilaterally.
Minimal atheromatous change within the right carotid siphon without
flow-limiting stenosis. Left carotid siphon widely patent. A1
segments patent. Normal anterior communicating artery complex.
Anterior cerebral arteries patent to their distal aspects without
stenosis. No M1 stenosis or occlusion. Normal MCA bifurcations.
Distal MCA branches well perfused and symmetric.

Posterior circulation: Both vertebral arteries patent to the
vertebrobasilar junction without stenosis. Both picas patent.
Basilar patent to its distal aspect without stenosis. Superior
cerebral arteries patent bilaterally. Both PCAs primarily supplied
via the basilar well perfused to their distal aspects.

Venous sinuses: Apparent focal filling defects seen throughout the
superior sagittal sinus to the torcula. Extension into the right
transverse and sigmoid sinuses to the level of the jugular bulb.
Finding concerning for dural sinus thrombosis. Left transverse and
sigmoid sinuses appear patent as does the proximal left internal
jugular vein. Possible additional partial filling defect noted
within the straight sinus.

Anatomic variants: None significant.  No intracranial aneurysm.

Review of the MIP images confirms the above findings
IMPRESSION: CT HEAD IMPRESSION:

1. Abnormal hypodensity involving the left parietal cortex,
concerning for acute ischemia. Few small foci of associated
hemorrhage as above.
2. No other acute intracranial abnormality.

CTA HEAD AND NECK IMPRESSION:

1. Negative CTA for large vessel occlusion. No hemodynamically
significant or correctable stenosis about the major arterial
vasculature of the head and neck.
2. Long segment filling defects within the superior sagittal, right
transverse, and sigmoid sinuses, concerning for dural sinus
thrombosis. Possible partial filling defect within the straight
sinus as well. Finding raises the possibility that the above
described left cerebral infarct may reflect a venous infarct due to
underlying occult cortical vein thrombosis. Further assessed with
dedicated MRI could be performed for further evaluation.

Critical Value/emergent results were discussed by telephone at the
time of interpretation on 06/17/2020 at 3 a.m. to provider SIAMAK
RAJENDRA ; ANIYAH HOGELAND , who verbally acknowledged these results.

## 2021-09-22 ENCOUNTER — Ambulatory Visit: Payer: BLUE CROSS/BLUE SHIELD | Admitting: Hematology & Oncology

## 2021-09-22 ENCOUNTER — Other Ambulatory Visit: Payer: BLUE CROSS/BLUE SHIELD

## 2022-01-07 ENCOUNTER — Ambulatory Visit: Payer: BLUE CROSS/BLUE SHIELD | Admitting: Neurology
